# Patient Record
Sex: Female | Born: 1980 | Race: White | Hispanic: No | Marital: Single
Health system: Southern US, Community
[De-identification: ages and names within clinical notes are randomized; demographics above are authoritative.]

## PROBLEM LIST (undated history)

## (undated) DIAGNOSIS — F319 Bipolar disorder, unspecified: Secondary | ICD-10-CM

## (undated) DIAGNOSIS — K5190 Ulcerative colitis, unspecified, without complications: Secondary | ICD-10-CM

## (undated) DIAGNOSIS — A419 Sepsis, unspecified organism: Secondary | ICD-10-CM

## (undated) DIAGNOSIS — G8929 Other chronic pain: Secondary | ICD-10-CM

## (undated) DIAGNOSIS — R011 Cardiac murmur, unspecified: Secondary | ICD-10-CM

## (undated) DIAGNOSIS — G894 Chronic pain syndrome: Secondary | ICD-10-CM

## (undated) DIAGNOSIS — J45909 Unspecified asthma, uncomplicated: Secondary | ICD-10-CM

## (undated) DIAGNOSIS — Z87890 Personal history of sex reassignment: Secondary | ICD-10-CM

## (undated) DIAGNOSIS — G4730 Sleep apnea, unspecified: Secondary | ICD-10-CM

## (undated) DIAGNOSIS — N289 Disorder of kidney and ureter, unspecified: Secondary | ICD-10-CM

## (undated) DIAGNOSIS — N319 Neuromuscular dysfunction of bladder, unspecified: Secondary | ICD-10-CM

## (undated) HISTORY — PX: KNEE ARTHROSCOPY: SUR90

## (undated) HISTORY — PX: INSERTION OF SUPRAPUBIC CATHETER: SHX5870

## (undated) HISTORY — PX: INSERTION CENTRAL VENOUS ACCESS DEVICE W/ SUBCUTANEOUS PORT: SUR725

## (undated) HISTORY — PX: MASTECTOMY: SHX3

## (undated) HISTORY — PX: BLADDER SURGERY: SHX569

---

## 2013-06-30 ENCOUNTER — Encounter (HOSPITAL_BASED_OUTPATIENT_CLINIC_OR_DEPARTMENT_OTHER): Payer: Self-pay | Admitting: Emergency Medicine

## 2013-06-30 ENCOUNTER — Inpatient Hospital Stay (HOSPITAL_BASED_OUTPATIENT_CLINIC_OR_DEPARTMENT_OTHER)
Admission: EM | Admit: 2013-06-30 | Discharge: 2013-07-07 | DRG: 314 | Disposition: A | Discharge status: Home / Self Care | Payer: Medicare (Managed Care) | Attending: Internal Medicine | Admitting: Internal Medicine

## 2013-06-30 ENCOUNTER — Emergency Department (HOSPITAL_BASED_OUTPATIENT_CLINIC_OR_DEPARTMENT_OTHER): Payer: Medicare (Managed Care)

## 2013-06-30 DIAGNOSIS — Z6841 Body Mass Index (BMI) 40.0 and over, adult: Secondary | ICD-10-CM

## 2013-06-30 DIAGNOSIS — Z87898 Personal history of other specified conditions: Secondary | ICD-10-CM

## 2013-06-30 DIAGNOSIS — F640 Transsexualism: Secondary | ICD-10-CM | POA: Diagnosis present

## 2013-06-30 DIAGNOSIS — K5909 Other constipation: Secondary | ICD-10-CM

## 2013-06-30 DIAGNOSIS — R6510 Systemic inflammatory response syndrome (SIRS) of non-infectious origin without acute organ dysfunction: Secondary | ICD-10-CM | POA: Diagnosis present

## 2013-06-30 DIAGNOSIS — N390 Urinary tract infection, site not specified: Secondary | ICD-10-CM | POA: Diagnosis present

## 2013-06-30 DIAGNOSIS — K5900 Constipation, unspecified: Secondary | ICD-10-CM | POA: Diagnosis present

## 2013-06-30 DIAGNOSIS — T80211A Bloodstream infection due to central venous catheter, initial encounter: Principal | ICD-10-CM | POA: Diagnosis present

## 2013-06-30 DIAGNOSIS — F1210 Cannabis abuse, uncomplicated: Secondary | ICD-10-CM | POA: Diagnosis present

## 2013-06-30 DIAGNOSIS — R7881 Bacteremia: Secondary | ICD-10-CM

## 2013-06-30 DIAGNOSIS — G894 Chronic pain syndrome: Secondary | ICD-10-CM

## 2013-06-30 DIAGNOSIS — F319 Bipolar disorder, unspecified: Secondary | ICD-10-CM

## 2013-06-30 DIAGNOSIS — G9050 Complex regional pain syndrome I, unspecified: Secondary | ICD-10-CM | POA: Diagnosis present

## 2013-06-30 DIAGNOSIS — Y92009 Unspecified place in unspecified non-institutional (private) residence as the place of occurrence of the external cause: Secondary | ICD-10-CM

## 2013-06-30 DIAGNOSIS — J45909 Unspecified asthma, uncomplicated: Secondary | ICD-10-CM

## 2013-06-30 DIAGNOSIS — R509 Fever, unspecified: Secondary | ICD-10-CM

## 2013-06-30 DIAGNOSIS — Z9359 Other cystostomy status: Secondary | ICD-10-CM

## 2013-06-30 DIAGNOSIS — Z931 Gastrostomy status: Secondary | ICD-10-CM

## 2013-06-30 DIAGNOSIS — E669 Obesity, unspecified: Secondary | ICD-10-CM | POA: Diagnosis present

## 2013-06-30 DIAGNOSIS — B9689 Other specified bacterial agents as the cause of diseases classified elsewhere: Secondary | ICD-10-CM | POA: Diagnosis present

## 2013-06-30 DIAGNOSIS — Y849 Medical procedure, unspecified as the cause of abnormal reaction of the patient, or of later complication, without mention of misadventure at the time of the procedure: Secondary | ICD-10-CM | POA: Diagnosis present

## 2013-06-30 DIAGNOSIS — N319 Neuromuscular dysfunction of bladder, unspecified: Secondary | ICD-10-CM | POA: Diagnosis present

## 2013-06-30 DIAGNOSIS — A4901 Methicillin susceptible Staphylococcus aureus infection, unspecified site: Secondary | ICD-10-CM | POA: Diagnosis present

## 2013-06-30 DIAGNOSIS — K5190 Ulcerative colitis, unspecified, without complications: Secondary | ICD-10-CM

## 2013-06-30 DIAGNOSIS — A419 Sepsis, unspecified organism: Secondary | ICD-10-CM

## 2013-06-30 HISTORY — DX: Cardiac murmur, unspecified: R01.1

## 2013-06-30 HISTORY — DX: Unspecified asthma, uncomplicated: J45.909

## 2013-06-30 HISTORY — DX: Other chronic pain: G89.29

## 2013-06-30 HISTORY — DX: Chronic pain syndrome: G89.4

## 2013-06-30 HISTORY — DX: Ulcerative colitis, unspecified, without complications: K51.90

## 2013-06-30 HISTORY — DX: Sepsis, unspecified organism: A41.9

## 2013-06-30 HISTORY — DX: Bipolar disorder, unspecified: F31.9

## 2013-06-30 HISTORY — DX: Disorder of kidney and ureter, unspecified: N28.9

## 2013-06-30 HISTORY — DX: Neuromuscular dysfunction of bladder, unspecified: N31.9

## 2013-06-30 HISTORY — DX: Sleep apnea, unspecified: G47.30

## 2013-06-30 HISTORY — DX: Personal history of sex reassignment: Z87.890

## 2013-06-30 LAB — CBC WITH DIFFERENTIAL/PLATELET
Eosinophils Relative: 1 % (ref 0–5)
HCT: 39 % (ref 39.0–52.0)
Lymphocytes Relative: 12 % (ref 12–46)
Lymphs Abs: 0.9 10*3/uL (ref 0.7–4.0)
MCHC: 33.8 g/dL (ref 30.0–36.0)
MCV: 85.5 fL (ref 78.0–100.0)
Monocytes Absolute: 0.5 10*3/uL (ref 0.1–1.0)
RBC: 4.56 MIL/uL (ref 4.22–5.81)
WBC: 7.9 10*3/uL (ref 4.0–10.5)

## 2013-06-30 LAB — URINALYSIS, ROUTINE W REFLEX MICROSCOPIC
Bilirubin Urine: NEGATIVE
Glucose, UA: NEGATIVE mg/dL
Hgb urine dipstick: NEGATIVE
Nitrite: POSITIVE — AB
Protein, ur: NEGATIVE mg/dL
Specific Gravity, Urine: 1.01 (ref 1.005–1.030)
Urobilinogen, UA: 0.2 mg/dL (ref 0.0–1.0)

## 2013-06-30 LAB — CG4 I-STAT (LACTIC ACID): Lactic Acid, Venous: 0.64 mmol/L (ref 0.5–2.2)

## 2013-06-30 LAB — COMPREHENSIVE METABOLIC PANEL
ALT: 12 U/L (ref 0–53)
AST: 14 U/L (ref 0–37)
CO2: 25 mEq/L (ref 19–32)
Calcium: 8.9 mg/dL (ref 8.4–10.5)
Creatinine, Ser: 1 mg/dL (ref 0.50–1.35)
GFR calc Af Amer: >90 mL/min (ref >90)
GFR calc non Af Amer: >90 mL/min (ref >90)
Glucose, Bld: 105 mg/dL — ABNORMAL HIGH (ref 70–99)
Sodium: 136 mEq/L (ref 135–145)
Total Bilirubin: 0.6 mg/dL (ref 0.3–1.2)
Total Protein: 6.5 g/dL (ref 6.0–8.3)

## 2013-06-30 LAB — INFLUENZA PANEL BY PCR (TYPE A & B)
H1N1 flu by pcr: NOT DETECTED
Influenza A By PCR: NEGATIVE
Influenza B By PCR: NEGATIVE

## 2013-06-30 LAB — MRSA PCR SCREENING: MRSA by PCR: NEGATIVE

## 2013-06-30 LAB — URINE MICROSCOPIC-ADD ON

## 2013-06-30 LAB — MAGNESIUM: Magnesium: 1.6 mg/dL (ref 1.5–2.5)

## 2013-06-30 MED ORDER — SORBITOL 70 % SOLN
30.0000 mL | Freq: Every day | Status: DC | PRN
  Filled 2013-06-30: qty 30

## 2013-06-30 MED ORDER — PROMETHAZINE HCL 25 MG/ML IJ SOLN: 25.0000 mg | Freq: Four times a day (QID) | INTRAMUSCULAR | Status: DC | PRN

## 2013-06-30 MED ORDER — POLYETHYLENE GLYCOL 3350 17 G PO PACK
17.0000 g | PACK | Freq: Every day | ORAL | Status: DC
  Administered 2013-06-30 – 2013-07-07 (×5): 17 g via ORAL
  Filled 2013-06-30 (×8): qty 1

## 2013-06-30 MED ORDER — LITHIUM CARBONATE ER 450 MG PO TBCR
900.0000 mg | EXTENDED_RELEASE_TABLET | Freq: Every day | ORAL | Status: DC
  Administered 2013-06-30 – 2013-07-06 (×7): 900 mg via ORAL
  Filled 2013-06-30 (×9): qty 2

## 2013-06-30 MED ORDER — SODIUM CHLORIDE 0.9 % IJ SOLN
3.0000 mL | Freq: Two times a day (BID) | INTRAMUSCULAR | Status: DC
  Administered 2013-06-30 – 2013-07-01 (×2): 3 mL via INTRAVENOUS

## 2013-06-30 MED ORDER — DRONABINOL 5 MG PO CAPS: 10.0000 mg | ORAL_CAPSULE | Freq: Four times a day (QID) | ORAL | Status: DC

## 2013-06-30 MED ORDER — ACETAMINOPHEN 500 MG PO TABS
ORAL_TABLET | ORAL | Status: AC
  Filled 2013-06-30: qty 1

## 2013-06-30 MED ORDER — ONDANSETRON HCL 4 MG/2ML IJ SOLN
4.0000 mg | Freq: Four times a day (QID) | INTRAMUSCULAR | Status: DC | PRN
  Administered 2013-06-30 – 2013-07-06 (×7): 4 mg via INTRAVENOUS
  Filled 2013-06-30 (×8): qty 2

## 2013-06-30 MED ORDER — TESTOSTERONE ENANTHATE 200 MG/ML IM SOLN: 140.0000 mg | INTRAMUSCULAR | Status: DC

## 2013-06-30 MED ORDER — OXYBUTYNIN CHLORIDE 5 MG PO TABS
5.0000 mg | ORAL_TABLET | Freq: Two times a day (BID) | ORAL | Status: DC
  Administered 2013-06-30 – 2013-07-07 (×13): 5 mg via ORAL
  Filled 2013-06-30 (×16): qty 1

## 2013-06-30 MED ORDER — LEVOFLOXACIN IN D5W 750 MG/150ML IV SOLN
750.0000 mg | INTRAVENOUS | Status: DC
  Administered 2013-06-30: 750 mg via INTRAVENOUS
  Filled 2013-06-30: qty 150

## 2013-06-30 MED ORDER — MAGNESIUM CITRATE PO SOLN: 1.0000 | Freq: Once | ORAL | Status: AC | PRN

## 2013-06-30 MED ORDER — SODIUM CHLORIDE 0.9 % IV SOLN
1000.0000 mL | Freq: Once | INTRAVENOUS | Status: AC
  Administered 2013-06-30: 1000 mL via INTRAVENOUS

## 2013-06-30 MED ORDER — FENTANYL 12 MCG/HR TD PT72
12.5000 ug | MEDICATED_PATCH | TRANSDERMAL | Status: DC
  Administered 2013-07-03: 12.5 ug via TRANSDERMAL
  Filled 2013-06-30: qty 1

## 2013-06-30 MED ORDER — DOCUSATE SODIUM 100 MG PO CAPS
100.0000 mg | ORAL_CAPSULE | Freq: Two times a day (BID) | ORAL | Status: DC
  Administered 2013-06-30 – 2013-07-07 (×12): 100 mg via ORAL
  Filled 2013-06-30 (×18): qty 1

## 2013-06-30 MED ORDER — FENTANYL 50 MCG/HR TD PT72
50.0000 ug | MEDICATED_PATCH | TRANSDERMAL | Status: DC
  Administered 2013-07-03: 50 ug via TRANSDERMAL
  Filled 2013-06-30: qty 1

## 2013-06-30 MED ORDER — BUPROPION HCL ER (SR) 150 MG PO TB12
300.0000 mg | ORAL_TABLET | Freq: Every day | ORAL | Status: DC
  Administered 2013-06-30 – 2013-07-07 (×7): 300 mg via ORAL
  Filled 2013-06-30 (×8): qty 2

## 2013-06-30 MED ORDER — VANCOMYCIN HCL IN DEXTROSE 1-5 GM/200ML-% IV SOLN
1000.0000 mg | Freq: Once | INTRAVENOUS | Status: DC
  Filled 2013-06-30: qty 200

## 2013-06-30 MED ORDER — DEXTROSE 5 % IV SOLN
1.0000 g | Freq: Three times a day (TID) | INTRAVENOUS | Status: DC
  Administered 2013-06-30 – 2013-07-02 (×4): 1 g via INTRAVENOUS
  Filled 2013-06-30 (×12): qty 1

## 2013-06-30 MED ORDER — METOCLOPRAMIDE HCL 10 MG PO TABS
10.0000 mg | ORAL_TABLET | Freq: Three times a day (TID) | ORAL | Status: DC | PRN
  Administered 2013-06-30 – 2013-07-07 (×2): 10 mg via ORAL
  Filled 2013-06-30 (×3): qty 1

## 2013-06-30 MED ORDER — DRONABINOL 2.5 MG PO CAPS
10.0000 mg | ORAL_CAPSULE | Freq: Three times a day (TID) | ORAL | Status: DC
  Filled 2013-06-30: qty 4

## 2013-06-30 MED ORDER — POLYETHYLENE GLYCOL 3350 17 G PO PACK
17.0000 g | PACK | Freq: Every day | ORAL | Status: DC | PRN
  Filled 2013-06-30: qty 1

## 2013-06-30 MED ORDER — ENOXAPARIN SODIUM 40 MG/0.4ML ~~LOC~~ SOLN: 40.0000 mg | SUBCUTANEOUS | Status: DC

## 2013-06-30 MED ORDER — ACETAMINOPHEN 500 MG PO TABS
1000.0000 mg | ORAL_TABLET | Freq: Once | ORAL | Status: AC
  Administered 2013-06-30: 1000 mg via ORAL

## 2013-06-30 MED ORDER — OXYCODONE HCL 5 MG PO TABS
15.0000 mg | ORAL_TABLET | Freq: Every day | ORAL | Status: DC
  Administered 2013-06-30 – 2013-07-07 (×32): 15 mg via ORAL
  Filled 2013-06-30 (×32): qty 3

## 2013-06-30 MED ORDER — LEVALBUTEROL HCL 0.63 MG/3ML IN NEBU
0.6300 mg | INHALATION_SOLUTION | RESPIRATORY_TRACT | Status: DC | PRN
  Filled 2013-06-30: qty 3

## 2013-06-30 MED ORDER — SODIUM CHLORIDE 0.9 % IJ SOLN
10.0000 mL | Freq: Two times a day (BID) | INTRAMUSCULAR | Status: DC
  Administered 2013-07-01: 10 mL via INTRAVENOUS

## 2013-06-30 MED ORDER — DEXTROSE 5 % IV SOLN
2.0000 g | Freq: Once | INTRAVENOUS | Status: DC
  Filled 2013-06-30: qty 2

## 2013-06-30 MED ORDER — SODIUM CHLORIDE 0.9 % IV SOLN
1000.0000 mL | INTRAVENOUS | Status: DC
  Administered 2013-06-30 – 2013-07-01 (×3): 1000 mL via INTRAVENOUS

## 2013-06-30 MED ORDER — DRONABINOL 2.5 MG PO CAPS
10.0000 mg | ORAL_CAPSULE | Freq: Three times a day (TID) | ORAL | Status: DC
  Administered 2013-06-30 – 2013-07-07 (×23): 10 mg via ORAL
  Filled 2013-06-30 (×23): qty 4

## 2013-06-30 MED ORDER — VANCOMYCIN HCL IN DEXTROSE 1-5 GM/200ML-% IV SOLN
1000.0000 mg | Freq: Once | INTRAVENOUS | Status: AC
  Administered 2013-06-30: 1000 mg via INTRAVENOUS
  Filled 2013-06-30: qty 200

## 2013-06-30 MED ORDER — TRAZODONE HCL 100 MG PO TABS
100.0000 mg | ORAL_TABLET | Freq: Every day | ORAL | Status: DC
  Administered 2013-06-30 – 2013-07-06 (×7): 100 mg via ORAL
  Filled 2013-06-30 (×9): qty 1

## 2013-06-30 MED ORDER — MORPHINE SULFATE 2 MG/ML IJ SOLN
2.0000 mg | INTRAMUSCULAR | Status: DC | PRN
  Administered 2013-07-04 – 2013-07-05 (×5): 2 mg via INTRAVENOUS
  Filled 2013-06-30 (×5): qty 1

## 2013-06-30 MED ORDER — DRONABINOL 2.5 MG PO CAPS: 10.0000 mg | ORAL_CAPSULE | Freq: Four times a day (QID) | ORAL | Status: DC

## 2013-06-30 MED ORDER — ACETAMINOPHEN 325 MG PO TABS
650.0000 mg | ORAL_TABLET | ORAL | Status: DC | PRN
  Administered 2013-06-30 – 2013-07-07 (×4): 650 mg via ORAL
  Filled 2013-06-30 (×4): qty 2

## 2013-06-30 MED ORDER — VANCOMYCIN HCL IN DEXTROSE 1-5 GM/200ML-% IV SOLN
1000.0000 mg | Freq: Three times a day (TID) | INTRAVENOUS | Status: DC
  Administered 2013-06-30 – 2013-07-01 (×4): 1000 mg via INTRAVENOUS
  Filled 2013-06-30 (×5): qty 200

## 2013-06-30 NOTE — H&P (Signed)
[facility] History and Physical  Carol [name] ZOX:[id number] DOB: 20-Sep-1980 DOA: 06/30/2013  Referring physician: Dr Gwendolyn Grant PCP: Dr Rockney Ghee, [place], [place]   Chief Complaint: Fever  HPI: Carol [name] is a 32 y.o. transgender, with hx of chronic pain, RSD s/p spinal stimulator, chronic constipation, chronic nausea and emesis s/p Hickman catheter placement summer 2014, s/p suprapubic cathether placement  05/2013 who presents to ED with fever up to 104. Patient states since July of 2014 has had 3 episodes of systemic inflammatory response/sepsis with unknown etiology. Patient states had some chills last night however temperature was normal. Patient woke up this morning to a fever of 101 which haven't went up as high as 104. Patient also noted some erythema around his Hickman site which he feels is secondary to irritation after changing the dressing. No purulence is noted around then. Patient does endorse chronic nausea which is unchanged chronic constipation which is unchanged. Patient denies any emesis. Patient does state has some chest discomfort associated with palpitations and whenever he gets a fever. Patient denies any diarrhea, no dysuria, no cough, no weakness, no headaches. Patient denies any shortness of breath. Patient presented to the ED admit her [place] chest x-ray which was done was unremarkable. Urinalysis which was done was nitrite positive small leukocytes 21-50 WBCs otherwise was within normal limits. Comprehensive metabolic profile which was done was unremarkable. Lactic acid level was 0.64. CBC which was done was within normal limits. We were called to admit the patient.    Review of Systems:  Constitutional:  No weight loss, night sweats, fatigue.  HEENT:  No headaches, Difficulty swallowing,Tooth/dental problems,Sore throat,  No sneezing, itching, ear ache, nasal congestion, post nasal drip,  Cardio-vascular:  No chest pain, Orthopnea, PND,  swelling in lower extremities, anasarca, dizziness, palpitations  GI:  No heartburn, indigestion, abdominal pain, nausea, vomiting, diarrhea, change in bowel habits, loss of appetite  Resp:  No shortness of breath with exertion or at rest. No excess mucus, no productive cough, No non-productive cough, No coughing up of blood.No change in color of mucus.No wheezing.No chest wall deformity  Skin:  no rash or lesions.  GU:  no dysuria, change in color of urine, no urgency or frequency. No flank pain.  Musculoskeletal:  No joint pain or swelling. No decreased range of motion. No back pain.  Psych:  No change in mood or affect. No depression or anxiety. No memory loss.   Past Medical History  Diagnosis Date  . Neurogenic bladder   . 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 Chronic pain   . Sepsis   . Asthma   . Colitis, ulcerative chronic     not currently taking any medications, aschol  . Bipolar 1 disorder   . Murmur   . Renal insufficiency   . Chronic pain syndrome 06/30/2013  . Asthma, chronic 06/30/2013  . Bipolar disorder, unspecified 06/30/2013  . Ulcerative colitis, unspecified 06/30/2013   Past Surgical History  Procedure Laterality Date  . Mastectomy Bilateral     transgender surgery  . Insertion of suprapubic catheter    . Bladder surgery    . Knee arthroscopy    . Insertion central venous access device w/ subcutaneous port     Social History:  reports that he has been passively smoking.  He does not have any smokeless tobacco history on file. He reports that he uses illicit drugs (Marijuana). He reports that he does not drink alcohol.  Allergies  Allergen Reactions  . Cephalosporins Anaphylaxis  . Lovenox [Enoxaparin Sodium] Anaphylaxis  . Sulfa Antibiotics Rash    History reviewed. No pertinent family history.   Prior to Admission medications   Medication Sig Start Date End Date Taking? Authorizing Provider  buPROPion (WELLBUTRIN SR) 150  MG 12 hr tablet Take 300 mg by mouth daily.   Yes Historical Provider, MD  docusate sodium (COLACE) 100 MG capsule Take 100 mg by mouth 2 (two) times daily.   Yes Historical Provider, MD  dronabinol (MARINOL) 10 MG capsule Take 10 mg by mouth 4 (four) times daily.    Yes Historical Provider, MD  fentaNYL (DURAGESIC - DOSED MCG/HR) 12 MCG/HR Place 12.5 mcg onto the skin every 3 (three) days.   Yes Historical Provider, MD  fentaNYL (DURAGESIC - DOSED MCG/HR) 25 MCG/HR patch Place 50 mcg onto the skin every 3 (three) days.   Yes Historical Provider, MD  lithium carbonate (ESKALITH) 450 MG CR tablet Take 900 mg by mouth at bedtime.   Yes Historical Provider, MD  metoCLOPramide (REGLAN) 10 MG tablet Take 10 mg by mouth every 8 (eight) hours as needed for nausea.   Yes Historical Provider, MD  ondansetron (ZOFRAN-ODT) 8 MG disintegrating tablet Take 8 mg by mouth 4 (four) times daily as needed for nausea or vomiting.    Yes Historical Provider, MD  oxybutynin (DITROPAN) 5 MG tablet Take 5 mg by mouth 2 (two) times daily.   Yes Historical Provider, MD  oxyCODONE (ROXICODONE) 15 MG immediate release tablet Take 15 mg by mouth 5 (five) times daily.    Yes Historical Provider, MD  polyethylene glycol (MIRALAX / GLYCOLAX) packet Take 17 g by mouth daily.   Yes Historical Provider, MD  promethazine (PHENERGAN) 25 MG/ML injection Inject 25 mg into the vein every 6 (six) hours as needed for nausea or vomiting.   Yes Historical Provider, MD  testosterone enanthate (DELATESTRYL) 200 MG/ML injection Inject 140 mg into the muscle every 14 (fourteen) days. For IM use only   Yes Historical Provider, MD  traZODone (DESYREL) 100 MG tablet Take 100 mg by mouth at bedtime.    Yes Historical Provider, MD  UNABLE TO FIND 1 each at bedtime. Med Name:  2 liters of normal saline with potassium.  Administered during bedtime daily for vomiting syndrome.   Yes Historical Provider, MD   Physical Exam: Filed Vitals:   06/30/13 1236   BP: 112/61  Pulse: 104  Temp: 100.9 F (38.3 C)  Resp: 18    BP 112/61  Pulse 104  Temp(Src) 100.9 F (38.3 C) (Oral)  Resp 18  Ht 5\' 5"  (1.651 m)  Wt 99.791 kg (220 lb)  BMI 36.61 kg/m2  SpO2 98%  General:  Appears calm and comfortable Eyes: PERRL, normal lids, irises & conjunctiva ENT: grossly normal hearing, lips & tongue Neck: no LAD, masses or thyromegaly Cardiovascular: Tachycardia, no m/r/g. No LE edema Respiratory: CTA bilaterally, no w/r/r. Normal respiratory effort. Abdomen: soft, ntnd, positive bowel sounds. Skin: Right upper chest wall around Hickman catheter site with some slight erythema underneath dressing. Musculoskeletal: grossly normal tone BUE/BLE Psychiatric: grossly normal mood and affect, speech fluent and appropriate Neurologic: Alert and oriented x3. On his 2 through 53 are grossly intact. No focal deficits. Sensation is intact. Visual fields are intact. Gait not tested secondary to safety.           Labs on Admission:  Basic Metabolic Panel:  Recent Labs Lab 06/30/13 1020  NA 136  K 4.1  CL 101  CO2 25  GLUCOSE 105*  BUN 7  CREATININE 1.00  CALCIUM 8.9   Liver Function Tests:  Recent Labs Lab 06/30/13 1020  AST 14  ALT 12  ALKPHOS 61  BILITOT 0.6  PROT 6.5  ALBUMIN 3.9   No results found for this basename: LIPASE, AMYLASE,  in the last 168 hours No results found for this basename: AMMONIA,  in the last 168 hours CBC:  Recent Labs Lab 06/30/13 1020  WBC 7.9  NEUTROABS 6.3  HGB 13.2  HCT 39.0  MCV 85.5  PLT 199   Cardiac Enzymes: No results found for this basename: CKTOTAL, CKMB, CKMBINDEX, TROPONINI,  in the last 168 hours  BNP (last 3 results) No results found for this basename: PROBNP,  in the last 8760 hours CBG: No results found for this basename: GLUCAP,  in the last 168 hours  Radiological Exams on Admission: Dg Chest Port 1 View  (if Code Sepsis Called)  06/30/2013   CLINICAL DATA:  Fever, cough  EXAM:  PORTABLE CHEST - 1 VIEW  COMPARISON:  None.  FINDINGS: Low lung volumes. The cardiac silhouette and mediastinal contours unremarkable. No focal regions of consolidation or focal infiltrates. A right-sided central venous catheter via an internal jugular approach is identified tip projecting in the region of the superior vena cava. The osseous structures are unremarkable.  IMPRESSION: No active disease.   Electronically Signed   By: Salome Holmes M.D.   On: 06/30/2013 11:08    EKG: Not done  Assessment/Plan Principal Problem:   Fever Active Problems:   UTI (urinary tract infection)   Chronic pain syndrome   Asthma, chronic   Neurogenic bladder   Bipolar disorder, unspecified   Ulcerative colitis, unspecified   SIRS (systemic inflammatory response syndrome)  #1 systemic inflammatory response/fever Questionable etiology. Urinalysis is nitrite positive. Urine cultures are pending. Patient has had 3 episodes of fevers between July of 2014 now with unknown etiology. Patient states urine cultures and up being negative. Chest x-ray is clear. The cultures are pending. Area around Hickman catheter site with some erythema however no drainage is noted. Will place empirically on IV vancomycin, IV Levaquin, IV history on them. We'll consult with ID for further evaluation and management.  #2 urinary tract infection Urine cultures are pending. Patient empirically on IV Levaquin.  #3 chronic asthma Stable. Nebs as needed.  #4 bipolar disorder Continue home regimen of lithium. Continue Wellbutrin. Check a lithium level.  #5 chronic pain syndrome Continue home pain regimen of fentanyl patches, oxycodone.  #6 chronic nausea Currently stable. Zofran as needed.  #7 history of ulcerative colitis Stable.  #8 prophylaxis PPI for GI prophylaxis. SCDs for DVT prophylaxis.  Code Status: Full Family Communication: updated patient. No family at bedside Disposition Plan: Admit to SDU  Time spent: 70  mins  [facility] MD [facility] Pager [phone]

## 2013-06-30 NOTE — ED Notes (Addendum)
Patient states he has had a fever for the last two days.  States he is visiting from [place] and arrived in [place] on 06/25/13.  States he has chronic vomiting syndrome and has a central line in his right chest for hydration.  States on 06/28/13 when he was changing his central line dressing on 06/28/13 he noticed that there was a large amount of redness around the site.  States he cut the dressing down to fit around the site.  Yesterday, he began to have a fever.  Was seen at a local urgent care yesterday.  Today his fever persist.  Patient also has a neurogenetic bladder and has a chronic suprapubic catheter in place.

## 2013-06-30 NOTE — ED Notes (Signed)
Dr. Loretha Stapler given message to call Twee at main pharmacy at [facility] regarding d/c of second gram of vanc, and cancellation of second antibiotic.

## 2013-06-30 NOTE — Progress Notes (Addendum)
ANTIBIOTIC CONSULT NOTE - INITIAL  Pharmacy Consult:  Vancomycin / Cefepime Indication:  Sepsis  Allergies  Allergen Reactions  . Cephalosporins Anaphylaxis  . Lovenox [Enoxaparin Sodium] Anaphylaxis  . Sulfa Antibiotics Rash    Patient Measurements: Height: 5\' 5"  (165.1 cm) Weight: 220 lb (99.791 kg) IBW/kg (Calculated) : 61.5  Vital Signs: Temp: 103.1 F (39.5 C) (12/26 1002) Temp src: Oral (12/26 1002) BP: 128/80 mmHg (12/26 1002)  Labs: No results found for this basename: WBC, HGB, PLT, LABCREA, CREATININE,  in the last 72 hours CrCl is unknown because no creatinine reading has been taken. No results found for this basename: VANCOTROUGH, VANCOPEAK, VANCORANDOM, GENTTROUGH, GENTPEAK, GENTRANDOM, TOBRATROUGH, TOBRAPEAK, TOBRARND, AMIKACINPEAK, AMIKACINTROU, AMIKACIN,  in the last 72 hours   Microbiology: No results found for this or any previous visit (from the past 720 hour(s)).  Medical History: Past Medical History  Diagnosis Date  . Neurogenic bladder   . 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 Chronic pain   . Sepsis   . Asthma   . Murmur   . Colitis, ulcerative chronic     not currently taking any medications, aschol  . Renal insufficiency   . Bipolar 1 disorder       Assessment: 36 YOM with history of neurogenic bladder with suprapubic catheter and chronic vomiting syndrome with central line for hydration admitted with complaint of fever x 2 days.  Pharmacy consulted to manage vancomycin and cefepime for sepsis.  Noted first doses of antibiotics already ordered.  Baseline labs pending.   Goal of Therapy:  Vancomycin trough level 15-20 mcg/ml   Plan:  - Vanc 1gm IV x 1 per MD - Cefepime 2gm IV x 1 per MD - F/U renal fxn for further dosing    Nawal Burling D. Laney Potash, PharmD, BCPS Pager:  [phone] 06/30/2013, 10:49 AM   ===================================   Addendum: SCr 1, CrCL 113 ml/min Anaphylaxis to cephalosporins, so change  to Azactam and LVQ for now   Plan: - Vanc 1gm IV Q8H - Monitor renal fxn, clinical course, vanc trough at Css - D/C Cefepime as patient has anaphylaxis to - Azactam 1gm IV Q8H - LVQ 750mg  IV Q24H    Amorah Sebring D. Laney Potash, PharmD, BCPS Pager:  [phone] 06/30/2013, 11:33 AM

## 2013-06-30 NOTE — Progress Notes (Signed)
Patient ID: Carol [name], female   DOB: 28-Dec-1980, 32 y.o.   MRN: [id number]         [facility]    Date of Admission:  06/30/2013               Day 1 vancomycin        Day 1 aztreonam        Day 1 levofloxacin       Reason for Consult: Recurrent fevers    Referring Physician: Dr. Ramiro Harvest Primary Care Physician: Dr. Collene Schlichter, [place], [place], [phone]  Principal Problem:   SIRS (systemic inflammatory response syndrome) Active Problems:   Fever   UTI (urinary tract infection)   Chronic pain syndrome   Asthma, chronic   Neurogenic bladder   Bipolar disorder, unspecified   Ulcerative colitis, unspecified   History of nausea and vomiting   Chronic constipation   Reflex sympathetic dystrophy   . acetaminophen      . aztreonam  1 g Intravenous Q8H  . buPROPion  300 mg Oral Daily  . docusate sodium  100 mg Oral BID  . dronabinol  10 mg Oral QID  . fentaNYL  12.5 mcg Transdermal Q72H  . fentaNYL  50 mcg Transdermal Q72H  . levofloxacin (LEVAQUIN) IV  750 mg Intravenous Q24H  . lithium carbonate  900 mg Oral QHS  . oxybutynin  5 mg Oral BID  . oxyCODONE  15 mg Oral 5 X Daily  . polyethylene glycol  17 g Oral Daily  . sodium chloride  3 mL Intravenous Q12H  . testosterone enanthate  140 mg Intramuscular Q14 Days  . traZODone  100 mg Oral QHS  . vancomycin  1,000 mg Intravenous Q8H    Recommendations: 1.  Continue vancomycin and aztreonam 2. Discontinue levofloxacin 3. Discontinue droplet precautions   Assessment: I do not see any obvious source for his current fevers or explanation for his recurring episodes of fever over the last several months. I certainly agree with empiric antibiotic therapy pending results of urine and blood cultures. I placed a call to his primary care physician but his office was already closed for the day. I will followup tomorrow.   HPI: Carol [name] is a 32 y.o. female (transgender female  to female) with a history of neurogenic bladder and lower extremity weakness. He lived with a chronic indwelling Foley catheter for many years but earlier this year underwent a procedure to construct an ileal urostomy. Apparently the procedure did not work and the urostomy had to be reversed. He had a suprapubic catheter placed at that time in July. He had a VAC wound dressing for several weeks. Since that time his chronic nausea and vomiting and constipation worsened. This required placement of a Hickman catheter for IV fluids. Since July he has had 3 episodes of fever that has led to hospitalization in [place] at [facility]. He says that his episodes have been described as SIRS. He states that his cultures have always been negative. He would be placed on antibiotics and improve within 48 hours. Blood cultures returned negative the antibiotics have generally been stopped and he would be discharged home. He has had negative abdominal scans the did not reveal any evidence of intra-abdominal infection. He states that he has lost about 50 pounds unintentionally since his last surgery in July. He is visiting family here for the holidays and developed a fever up to 104 yesterday leading to admission here. He  feels better today.   Review of Systems: Constitutional: positive for anorexia, chills, fevers, malaise and weight loss, negative for sweats Eyes: negative Ears, nose, mouth, throat, and face: negative Respiratory: negative Cardiovascular: negative Gastrointestinal: positive for constipation, nausea and vomiting, negative for change in bowel habits and diarrhea Genitourinary:positive for slight increase in particulate matter in urine recently, negative for hematuria Musculoskeletal:negative  Past Medical History  Diagnosis Date  . Neurogenic bladder   . 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 Chronic pain   . Sepsis   . Asthma   . Colitis, ulcerative chronic      not currently taking any medications, aschol  . Bipolar 1 disorder   . Murmur   . Renal insufficiency   . Chronic pain syndrome 06/30/2013  . Asthma, chronic 06/30/2013  . Bipolar disorder, unspecified 06/30/2013  . Ulcerative colitis, unspecified 06/30/2013    History  Substance Use Topics  . Smoking status: Passive Smoke Exposure - Never Smoker  . Smokeless tobacco: Not on file  . Alcohol Use: No    History reviewed. No pertinent family history. Allergies  Allergen Reactions  . Cephalosporins Anaphylaxis  . Lovenox [Enoxaparin Sodium] Anaphylaxis  . Sulfa Antibiotics Rash    OBJECTIVE: Blood pressure 108/73, pulse 98, temperature 100.4 F (38 C), temperature source Oral, resp. rate 9, height 5\' 5"  (1.651 m), weight 99.791 kg (220 lb), SpO2 99.00%.  General:  He is in no distress visiting with family Skin:  No rash. He has some erythema in the distribution of his previous Tegaderm dressing over his Hickman catheter. No other abnormalities noted on catheter site Oral: No oropharyngeal lesions Lungs:  Clear Cor:  Regular S1 and S2 no murmurs Abdomen:  Multiple healed surgical incisions. Suprapubic catheter site appears normal Neuro: Alert and fully oriented with normal speech and conversation. Chronic, stable extremity weakness Joints and extremities: No acute abnormalities  Lab Results Lab Results  Component Value Date   WBC 7.9 06/30/2013   HGB 13.2 06/30/2013   HCT 39.0 06/30/2013   MCV 85.5 06/30/2013   PLT 199 06/30/2013    Lab Results  Component Value Date   CREATININE 1.00 06/30/2013   BUN 7 06/30/2013   NA 136 06/30/2013   K 4.1 06/30/2013   CL 101 06/30/2013   CO2 25 06/30/2013    Lab Results  Component Value Date   ALT 12 06/30/2013   AST 14 06/30/2013   ALKPHOS 61 06/30/2013   BILITOT 0.6 06/30/2013     Microbiology: No results found for this or any previous visit (from the past 240 hour(s)).  Cliffton Asters, MD [facility] [phone] pager   [phone] cell 06/30/2013, 5:09 PM

## 2013-06-30 NOTE — Plan of Care (Signed)
Problem: Phase I Progression Outcomes Goal: Voiding-avoid urinary catheter unless indicated Outcome: Not Applicable Date Met:  06/30/13 Pt has supra pubic cath

## 2013-06-30 NOTE — ED Provider Notes (Signed)
CSN: [id number]     Arrival date & time 06/30/13  0941 History   First MD Initiated Contact with Patient 06/30/13 [phone]     Chief Complaint  Patient presents with  . Fever   (Consider location/radiation/quality/duration/timing/severity/associated sxs/prior Treatment) Patient is a 32 y.o. female presenting with fever. The history is provided by the patient.  Fever Max temp prior to arrival:  103 Temp source:  Oral Severity:  Severe Onset quality:  Sudden Timing:  Constant Progression:  Worsening Chronicity:  Recurrent Relieved by:  Nothing Worsened by:  Nothing tried Ineffective treatments:  None tried Associated symptoms: no chest pain, no cough, no diarrhea and no vomiting     Past Medical History  Diagnosis Date  . Neurogenic bladder   . 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 Chronic pain   . Sepsis   . Asthma   . Murmur   . Colitis, ulcerative chronic     not currently taking any medications, aschol  . Renal insufficiency   . Bipolar 1 disorder    Past Surgical History  Procedure Laterality Date  . Mastectomy Bilateral     transgender surgery  . Insertion of suprapubic catheter    . Bladder surgery    . Knee arthroscopy    . Insertion central venous access device w/ subcutaneous port     No family history on file. History  Substance Use Topics  . Smoking status: Passive Smoke Exposure - Never Smoker  . Smokeless tobacco: Not on file  . Alcohol Use: No    Review of Systems  Constitutional: Negative for fever.  Respiratory: Negative for cough and shortness of breath.   Cardiovascular: Negative for chest pain and leg swelling.  Gastrointestinal: Negative for vomiting, abdominal pain and diarrhea.  All other systems reviewed and are negative.    Allergies  Cephalosporins; Lovenox; and Sulfa antibiotics  Home Medications   Current Outpatient Rx  Name  Route  Sig  Dispense  Refill  . buPROPion (WELLBUTRIN XL) 300 MG 24 hr tablet  Oral   Take 300 mg by mouth daily.         Marland Kitchen docusate sodium (COLACE) 100 MG capsule   Oral   Take 100 mg by mouth 2 (two) times daily.         Marland Kitchen dronabinol (MARINOL) 10 MG capsule   Oral   Take 10 mg by mouth 2 (two) times daily before a meal. Only uses Marinol in the hospital , vaporizers marijuana at home.         . fentaNYL (DURAGESIC - DOSED MCG/HR) 75 MCG/HR   Transdermal   Place 72 mcg onto the skin every 3 (three) days.         Marland Kitchen lithium 600 MG capsule   Oral   Take 900 mg by mouth at bedtime.         . metoCLOPramide (REGLAN) 10 MG tablet   Oral   Take 10 mg by mouth every 8 (eight) hours as needed for nausea.         . ondansetron (ZOFRAN-ODT) 8 MG disintegrating tablet   Oral   Take 8 mg by mouth every 8 (eight) hours as needed for nausea or vomiting.         Marland Kitchen oxyCODONE (ROXICODONE) 15 MG immediate release tablet   Oral   Take 15 mg by mouth every 4 (four) hours as needed for pain.         Marland Kitchen  polyethylene glycol (MIRALAX / GLYCOLAX) packet   Oral   Take 17 g by mouth daily.         . promethazine (PHENERGAN) 25 MG tablet   Oral   Take 25 mg by mouth every 6 (six) hours as needed for nausea or vomiting.         Marland Kitchen testosterone enanthate (DELATESTRYL) 200 MG/ML injection   Intramuscular   Inject 140 mg into the muscle every 14 (fourteen) days. For IM use only         . traZODone (DESYREL) 100 MG tablet   Oral   Take 100 mg by mouth at bedtime as needed for sleep.         Marland Kitchen UNABLE TO FIND      Med Name:2 liters of normal saline with potassium ? Amount during bedtime daily for vomiting syndrome.        BP 128/80  Temp(Src) 103.1 F (39.5 C) (Oral)  Resp 20  Ht 5\' 5"  (1.651 m)  Wt 220 lb (99.791 kg)  BMI 36.61 kg/m2  SpO2 98% Physical Exam  Nursing note and vitals reviewed. Constitutional: He is oriented to person, place, and time. He appears well-developed and well-nourished. No distress.  HENT:  Head: Normocephalic and  atraumatic.  Mouth/Throat: No oropharyngeal exudate.  Eyes: EOM are normal. Pupils are equal, round, and reactive to light.  Neck: Normal range of motion. Neck supple.  Cardiovascular: Regular rhythm.  Tachycardia present.  Exam reveals no friction rub.   No murmur heard. Pulmonary/Chest: Effort normal and breath sounds normal. No respiratory distress. He has no wheezes. He has no rales.  Abdominal: He exhibits no distension. There is no tenderness. There is no rebound.  Musculoskeletal: Normal range of motion. He exhibits no edema.  Neurological: He is alert and oriented to person, place, and time.  Skin: He is not diaphoretic.    ED Course  Procedures (including critical care time) Labs Review Labs Reviewed  CULTURE, BLOOD (ROUTINE X 2)  CULTURE, BLOOD (ROUTINE X 2)  URINE CULTURE  CULTURE, BLOOD (SINGLE)  LACTIC ACID, PLASMA  CBC WITH DIFFERENTIAL  COMPREHENSIVE METABOLIC PANEL  URINALYSIS, ROUTINE W REFLEX MICROSCOPIC   Imaging Review No results found.  EKG Interpretation   None     CRITICAL CARE Performed by: Dagmar Hait   Total critical care time: 30 minutes  Critical care time was exclusive of separately billable procedures and treating other patients.  Critical care was necessary to treat or prevent imminent or life-threatening deterioration.  Critical care was time spent personally by me on the following activities: development of treatment plan with patient and/or surrogate as well as nursing, discussions with consultants, evaluation of patient's response to treatment, examination of patient, obtaining history from patient or surrogate, ordering and performing treatments and interventions, ordering and review of laboratory studies, ordering and review of radiographic studies, pulse oximetry and re-evaluation of patient's condition.   MDM   1. Fever   2. Sepsis   3. Suprapubic catheter    32 year old female presents with fever. He is is surgically  transgender transsexual. Patient had sudden onset of fever last 0.3. Denies any vomiting, diarrhea, shortness of breath, cough, chest pain, sore throat, ear pain. Patient had no sick contacts. Of note, patient does have a right-sided Hickman catheter in his chest. I was placed for intractable nausea vomiting. Patient also is multiple other medical problems including reflex sympathetic dystrophy, chronic pain requiring a spinal stimulator, suprapubic catheter. He was trying  to receive a Mitrofanoff but it failed and he now suprapubic catheter. The Hickman catheter was used to give him fluids for his intractable nausea and vomiting. He has never had a catheter infection before. He reports multiple episodes of fevers and he is diagnosed with service and was given antibiotics but usually gets better. There have catheter line infections. He said he recently had cultures drawn that showed strep pneumo, but his primary doctor that is secondary to a contaminant repeat culture normal. He is not on any chronic antibiotics. Patient here febrile, tachycardic. Normotensive. Lungs are clear, belly is soft. His right chest Hickman catheter has some redness, however this is in the shape of his adhesive dressing and does not appear cellulitic. He has any drainage from his catheter. He is and is running cellulitis. His belly is benign. His suprapubic catheter site is benign. Concern for possible line infection versus other source of sepsis. Sepsis protocol initiated, cultures drawn, labs drawn, fluids initiated. Patient admitted to [facility] at [facility] for further evaluation and observation.   Dagmar Hait, MD 06/30/13 [phone]

## 2013-07-01 ENCOUNTER — Inpatient Hospital Stay (HOSPITAL_COMMUNITY): Payer: Medicare (Managed Care)

## 2013-07-01 DIAGNOSIS — B9689 Other specified bacterial agents as the cause of diseases classified elsewhere: Secondary | ICD-10-CM

## 2013-07-01 DIAGNOSIS — R7881 Bacteremia: Secondary | ICD-10-CM

## 2013-07-01 DIAGNOSIS — R82998 Other abnormal findings in urine: Secondary | ICD-10-CM

## 2013-07-01 LAB — COMPREHENSIVE METABOLIC PANEL
ALT: 9 U/L (ref 0–53)
AST: 14 U/L (ref 0–37)
Albumin: 2.9 g/dL — ABNORMAL LOW (ref 3.5–5.2)
BUN: 6 mg/dL (ref 6–23)
Calcium: 8 mg/dL — ABNORMAL LOW (ref 8.4–10.5)
Chloride: 98 mEq/L (ref 96–112)
Creatinine, Ser: 0.94 mg/dL (ref 0.50–1.35)
Sodium: 136 mEq/L (ref 135–145)
Total Bilirubin: 0.4 mg/dL (ref 0.3–1.2)
Total Protein: 5.3 g/dL — ABNORMAL LOW (ref 6.0–8.3)

## 2013-07-01 LAB — CBC
HCT: 35.2 % — ABNORMAL LOW (ref 39.0–52.0)
Hemoglobin: 11.7 g/dL — ABNORMAL LOW (ref 13.0–17.0)
MCH: 29 pg (ref 26.0–34.0)
MCV: 87.3 fL (ref 78.0–100.0)
RBC: 4.03 MIL/uL — ABNORMAL LOW (ref 4.22–5.81)
WBC: 5.5 10*3/uL (ref 4.0–10.5)

## 2013-07-01 MED ORDER — BOOST / RESOURCE BREEZE PO LIQD
1.0000 | Freq: Two times a day (BID) | ORAL | Status: DC
  Administered 2013-07-01 – 2013-07-07 (×7): 1 via ORAL

## 2013-07-01 MED ORDER — ADULT MULTIVITAMIN W/MINERALS CH
1.0000 | ORAL_TABLET | Freq: Every day | ORAL | Status: DC
  Administered 2013-07-01 – 2013-07-07 (×5): 1 via ORAL
  Filled 2013-07-01 (×7): qty 1

## 2013-07-01 MED ORDER — SODIUM CHLORIDE 0.9 % IJ SOLN
10.0000 mL | INTRAMUSCULAR | Status: DC | PRN
  Administered 2013-07-01 – 2013-07-03 (×5): 10 mL

## 2013-07-01 MED ORDER — SODIUM CHLORIDE 0.9 % IV SOLN
INTRAVENOUS | Status: DC
  Administered 2013-06-30 – 2013-07-06 (×7): via INTRAVENOUS

## 2013-07-01 MED ORDER — VANCOMYCIN HCL 10 G IV SOLR
1250.0000 mg | Freq: Three times a day (TID) | INTRAVENOUS | Status: DC
  Administered 2013-07-02 – 2013-07-07 (×18): 1250 mg via INTRAVENOUS
  Filled 2013-07-01 (×19): qty 1250

## 2013-07-01 NOTE — Evaluation (Signed)
Physical Therapy Evaluation Patient Details Name: Carol [name] MRN: [id number] DOB: 1980/10/11 Today's Date: 07/01/2013 Time: 4098-1191 PT Time Calculation (min): 30 min  PT Assessment / Plan / Recommendation History of Present Illness  Pt admit for SIRS.  History of RSD.    Clinical Impression  Pt admitted with above. Pt currently with functional limitations due to the deficits listed below (see PT Problem List).  Pt will benefit from skilled PT to increase their independence and safety with mobility to allow discharge to the venue listed below.     PT Assessment  Patient needs continued PT services    Follow Up Recommendations  Home health PT;Supervision/Assistance - 24 hour                Equipment Recommendations  Rolling walker with 5" wheels         Frequency Min 3X/week    Precautions / Restrictions Precautions Precautions: Fall Restrictions Weight Bearing Restrictions: No   Pertinent Vitals/Pain VSS, no pain per pt      Mobility  Bed Mobility Bed Mobility: Rolling Left;Left Sidelying to Sit;Sitting - Scoot to Edge of Bed Rolling Left: 4: Min guard;With rail Left Sidelying to Sit: 4: Min assist;With rails;HOB elevated Sitting - Scoot to Edge of Bed: 4: Min guard Details for Bed Mobility Assistance: Pt needed a little assist for technique. Transfers Transfers: Sit to Stand;Stand to [employer] Transfers Sit to Stand: 4: Min assist;With upper extremity assist;From bed Stand to Sit: 4: Min assist;With upper extremity assist;To chair/3-in-1;With armrests Stand Pivot Transfers: 4: Min assist Details for Transfer Assistance: Pt able to perform stand pivot with steadying assist at bil UES to get from bed to chair.   Ambulation/Gait Ambulation/Gait Assistance: Not tested (comment) Stairs: No Wheelchair Mobility Wheelchair Mobility: No         PT Diagnosis: Generalized weakness  PT Problem List: Decreased activity tolerance;Decreased balance;Decreased  mobility;Decreased knowledge of use of DME;Decreased safety awareness;Decreased knowledge of precautions PT Treatment Interventions: DME instruction;Gait training;Functional mobility training;Therapeutic activities;Therapeutic exercise;Balance training;Patient/family education     PT Goals(Current goals can be found in the care plan section) Acute Rehab PT Goals Patient Stated Goal: to go home PT Goal Formulation: With patient Time For Goal Achievement: 07/08/13 Potential to Achieve Goals: Good  Visit Information  Last PT Received On: 07/01/13 Assistance Needed: +2 (for ambulation) History of Present Illness: Pt admit for SIRS.  History of RSD.         Prior Functioning  Home Living Family/patient expects to be discharged to:: Private residence Living Arrangements: Alone Available Help at Discharge: Available PRN/intermittently (close to 20 1/2 hours of care a day.  ) Type of Home: House Home Access: Level entry Home Layout: One level Home Equipment: Wheelchair - manual (high toilet, loftstrand crutches) Additional Comments: Reports he has used the wheelchair more lately and not the crutches as he has been getting weaker.   Prior Function Level of Independence: Independent with assistive device(s) Comments: States people provided him min assist when needed for all activity.   Communication Communication: No difficulties    Cognition  Cognition Arousal/Alertness: Awake/alert Behavior During Therapy: WFL for tasks assessed/performed Overall Cognitive Status: Within Functional Limits for tasks assessed    Extremity/Trunk Assessment Upper Extremity Assessment Upper Extremity Assessment: Defer to OT evaluation Lower Extremity Assessment Lower Extremity Assessment: Generalized weakness   Balance Balance Balance Assessed: Yes Static Sitting Balance Static Sitting - Balance Support: Feet supported;No upper extremity supported Static Sitting - Level of Assistance: 5:  Stand by  assistance Static Sitting - Comment/# of Minutes: 2  End of Session PT - End of Session Equipment Utilized During Treatment: Gait belt Activity Tolerance: Patient limited by fatigue Patient left: in chair;with call bell/phone within reach Nurse Communication: Mobility status       INGOLD,Endia Moncur 07/01/2013, 3:11 PM [facility] Acute Rehabilitation [phone] (pager)

## 2013-07-01 NOTE — Progress Notes (Signed)
[facility] Progress Note  TEAM 1 - Stepdown/ICU TEAM   Carol [name] WUJ:[id number] DOB: December 15, 1980 DOA: 06/30/2013 PCP: No primary provider on file.  Admit HPI / Brief Narrative: 32 y.o. female (transgender female to female) with a history of neurogenic bladder and lower extremity weakness. He lived with a chronic indwelling Foley catheter for many years but earlier this year underwent a procedure to construct an ileal urostomy. Apparently the procedure did not work and the urostomy had to be reversed. He had a suprapubic catheter placed at that time in July. He had a VAC wound dressing for several weeks. Since that time his chronic nausea and vomiting and constipation worsened. This required placement of a Hickman catheter for IV fluids. Since July he has had 3 episodes of fever that has led to hospitalization in [place] at [facility]. He says that his episodes have been described as SIRS. He states that his cultures have always been negative. He would be placed on antibiotics and improve within 48 hours. Blood cultures returned negative the antibiotics have generally been stopped and he would be discharged home. He has had negative abdominal scans the did not reveal any evidence of intra-abdominal infection. He states that he has lost about 50 pounds unintentionally since his last surgery in July. He is visiting family here for the holidays and developed a fever up to 104 yesterday leading to admission here. (copied from Dr. Cliffton Asters, ID)  HPI/Subjective: The patient states he feels much better today.  He denies chest pain shortness of breath nausea or vomiting.  He does report crampy abdominal pain which is chronic for him and related to constipation.  Assessment/Plan:  Sepsis (HR 104, Temp 100.9 + UTI + positive blood cx) Searching for source - ID following with [place]   Gram negative rod UTI Suspect this may be chronic contamination or colonization - covered  w/ current abx regardlesss  Gram + cocci in blood cx Worrisome for catheter related smoldering infection, or even SBE - ID following - remains on Vanc - await speciation and sensitivities  Relapsing fevers Patient has had 3 episodes of fevers between July 2014 & now with unknown etiology - as per discussion above   Neurogenic bladder Has suprapubic cath s/p failed illeal conduit  Asthma Well compensated  Bipolar   Chronic pain syndrome Pain currently well controlled   Chronic nausea  Ulcerative colitis  Has been quiescent for years per his report   Obesity - Body mass index is 42.85 kg/(m^2).  Code Status: FULL Family Communication: no family present at time of exam Disposition Plan: stable for transfer to medical bed   Consultants: ID  Procedures: none  Antibiotics: Aztreonam 12/26 >> Levofloxacin 12/26 Vancomycin 12/26 >>  DVT prophylaxis: SCDs only - reported "anayphylaxis" to lovenox   Objective: Blood pressure 110/57, pulse 85, temperature 99 F (37.2 C), temperature source Oral, resp. rate 15, height 5\' 5"  (1.651 m), weight 116.8 kg (257 lb 8 oz), SpO2 97.00%.  Intake/Output Summary (Last 24 hours) at 07/01/13 1248 Last data filed at 07/01/13 0800  Gross per 24 hour  Intake 3221.25 ml  Output   3775 ml  Net -553.75 ml   Exam: General: No acute respiratory distress Chest:  R chest cath site w/o signif erythema or d/c  Lungs: Clear to auscultation bilaterally without wheezes or crackles Cardiovascular: Regular rate and rhythm without murmur gallop or rub normal S1 and S2 Abdomen: Nontender, nondistended, soft, bowel sounds positive, no rebound, no  ascites, no appreciable mass Extremities: No significant cyanosis, clubbing, or edema bilateral lower extremities  Data Reviewed: Basic Metabolic Panel:  Recent Labs Lab 06/30/13 1020 06/30/13 1634 07/01/13 0425  NA 136  --  136  K 4.1  --  3.7  CL 101  --  98  CO2 25  --  29  GLUCOSE 105*  --   126*  BUN 7  --  6  CREATININE 1.00  --  0.94  CALCIUM 8.9  --  8.0*  MG  --  1.6  --    Liver Function Tests:  Recent Labs Lab 06/30/13 1020 07/01/13 0425  AST 14 14  ALT 12 9  ALKPHOS 61 49  BILITOT 0.6 0.4  PROT 6.5 5.3*  ALBUMIN 3.9 2.9*   CBC:  Recent Labs Lab 06/30/13 1020 07/01/13 0425  WBC 7.9 5.5  NEUTROABS 6.3  --   HGB 13.2 11.7*  HCT 39.0 35.2*  MCV 85.5 87.3  PLT 199 165    Recent Results (from the past 240 hour(s))  CULTURE, BLOOD (ROUTINE X 2)     Status: None   Collection Time    06/30/13 10:20 AM      Result Value Range Status   Specimen Description BLOOD RT. ARM   Final   Special Requests Normal BOTTLES DRAWN AEROBIC AND ANAEROBIC  10CC   Final   Culture  Setup Time     Final   Value: 06/30/2013 14:26     Performed at [facility]   Culture     Final   Value: GRAM POSITIVE COCCI IN CLUSTERS     Note: Gram Stain Report Called to,Read Back By and Verified With: [facility] STOWE 07/01/13 AT 0515 RIDK     Performed at [facility]   Report Status PENDING   Incomplete  CULTURE, BLOOD (SINGLE)     Status: None   Collection Time    06/30/13 10:40 AM      Result Value Range Status   Specimen Description BLOOD LFT CHEST   Final   Special Requests Normal BOTTLES DRAWN AEROBIC AND ANAEROBIC 5CC   Final   Culture  Setup Time     Final   Value: 06/30/2013 14:26     Performed at [facility]   Culture     Final   Value:        BLOOD CULTURE RECEIVED NO GROWTH TO DATE CULTURE WILL BE HELD FOR 5 DAYS BEFORE ISSUING A FINAL NEGATIVE REPORT     Performed at [facility]   Report Status PENDING   Incomplete  URINE CULTURE     Status: None   Collection Time    06/30/13 11:15 AM      Result Value Range Status   Specimen Description URINE, SUPRAPUBIC   Final   Special Requests Normal   Final   Culture  Setup Time     Final   Value: 06/30/2013 14:27     Performed at [facility] Count     Final   Value:  >=100,000 COLONIES/ML     Performed at [facility]   Culture     Final   Value: GRAM NEGATIVE RODS     Performed at [facility]   Report Status PENDING   Incomplete  MRSA PCR SCREENING     Status: None   Collection Time    06/30/13  3:58 PM      Result  Value Range Status   MRSA by PCR NEGATIVE  NEGATIVE Final   Comment:            The GeneXpert MRSA Assay (FDA     approved for NASAL specimens     only), is one component of a     comprehensive MRSA colonization     surveillance program. It is not     intended to diagnose MRSA     infection nor to guide or     monitor treatment for     MRSA infections.     Studies:  Recent x-ray studies have been reviewed in detail by the Attending Physician  Scheduled Meds:  Scheduled Meds: . aztreonam  1 g Intravenous Q8H  . buPROPion  300 mg Oral Daily  . docusate sodium  100 mg Oral BID  . dronabinol  10 mg Oral TID AC & HS  . feeding supplement (RESOURCE BREEZE)  1 Container Oral BID BM  . fentaNYL  12.5 mcg Transdermal Q72H  . fentaNYL  50 mcg Transdermal Q72H  . lithium carbonate  900 mg Oral QHS  . multivitamin with minerals  1 tablet Oral Daily  . oxybutynin  5 mg Oral BID  . oxyCODONE  15 mg Oral 5 X Daily  . polyethylene glycol  17 g Oral Daily  . sodium chloride  10 mL Intravenous Q12H  . sodium chloride  3 mL Intravenous Q12H  . [START ON 07/08/2013] testosterone enanthate  140 mg Intramuscular Q14 Days  . traZODone  100 mg Oral QHS  . vancomycin  1,000 mg Intravenous Q8H    Time spent on care of this patient: 35 mins   [facility]  [facility] Office  [phone] Pager - Text Page per Loretha Stapler as per below:  On-Call/Text Page:      [email or web address]      password [email or web address]  If 7PM-7AM, please contact night-coverage [email or web address] Password [email or web address] 07/01/2013, 12:48 PM   LOS: 1 day

## 2013-07-01 NOTE — Progress Notes (Signed)
INITIAL NUTRITION ASSESSMENT  DOCUMENTATION CODES Per approved criteria  -Morbid Obesity   INTERVENTION: Provide Resource breeze BID Encourage healthful eating Provide Multivitamin with minerals daily  NUTRITION DIAGNOSIS: Inadequate oral intake related to poor appetite and nausea as evidenced by 18% weight loss in less than 6 months per pt report.   Goal: Pt to meet >/= 90% of their estimated nutrition needs   Monitor:  PO intake Weight Labs  Reason for Assessment: Malnutrition Screening Tool, score of 4  32 y.o. female  Admitting Dx: SIRS (systemic inflammatory response syndrome)  ASSESSMENT: 32 y.o. transgender, with hx of chronic pain, RSD s/p spinal stimulator, chronic constipation, chronic nausea and emesis s/p Hickman catheter placement summer 2014, s/p suprapubic cathether placement 05/2013 who presents to ED with fever up to 104. Patient states since July of 2014 has had 3 episodes of systemic inflammatory response/sepsis with unknown etiology.   Pt reports that at the beginning of the Summer he weighed 270 lbs but, due to frequent illness, nausea, vomiting, and poor appetite he lost down to 220 lbs. Pt reports weighing 220 lbs PTA but, current weight in chart is 257 lbs. Pt states he typically only eats one Lean Cuisine dinner daily. He has been told to drink [employer] daily due to poor po intake, weight loss, and frequent nausea. Encouraged pt to eat more frequently throughout the day; encouraged pt to eat all food groups daily. Encouraged pt to snack if skipping meals. Pt reports poor appetite today; ate a biscuit and a peice of bacon for breakfast.   Height: Ht Readings from Last 1 Encounters:  06/30/13 5\' 5"  (1.651 m)    Weight: Wt Readings from Last 1 Encounters:  07/01/13 257 lb 8 oz (116.8 kg)    Ideal Body Weight: 136 lbs  % Ideal Body Weight: 189%  Wt Readings from Last 10 Encounters:  07/01/13 257 lb 8 oz (116.8 kg)    Usual Body Weight: 270  lbs  % Usual Body Weight: 95%  BMI:  Body mass index is 42.85 kg/(m^2).  Estimated Nutritional Needs: Kcal: 1900-2100 Protein: 100-120 grams Fluid: 2.9 L/day  Skin: intact  Diet Order: General  EDUCATION NEEDS: -No education needs identified at this time   Intake/Output Summary (Last 24 hours) at 07/01/13 1051 Last data filed at 07/01/13 0800  Gross per 24 hour  Intake 3221.25 ml  Output   3775 ml  Net -553.75 ml    Last BM: 12/12  Labs:   Recent Labs Lab 06/30/13 1020 06/30/13 1634 07/01/13 0425  NA 136  --  136  K 4.1  --  3.7  CL 101  --  98  CO2 25  --  29  BUN 7  --  6  CREATININE 1.00  --  0.94  CALCIUM 8.9  --  8.0*  MG  --  1.6  --   GLUCOSE 105*  --  126*    CBG (last 3)  No results found for this basename: GLUCAP,  in the last 72 hours  Scheduled Meds: . aztreonam  1 g Intravenous Q8H  . buPROPion  300 mg Oral Daily  . docusate sodium  100 mg Oral BID  . dronabinol  10 mg Oral TID AC & HS  . fentaNYL  12.5 mcg Transdermal Q72H  . fentaNYL  50 mcg Transdermal Q72H  . lithium carbonate  900 mg Oral QHS  . oxybutynin  5 mg Oral BID  . oxyCODONE  15 mg Oral 5  X Daily  . polyethylene glycol  17 g Oral Daily  . sodium chloride  10 mL Intravenous Q12H  . sodium chloride  3 mL Intravenous Q12H  . [START ON 07/08/2013] testosterone enanthate  140 mg Intramuscular Q14 Days  . traZODone  100 mg Oral QHS  . vancomycin  1,000 mg Intravenous Q8H    Continuous Infusions: . sodium chloride 1,000 mL (07/01/13 0540)    Past Medical History  Diagnosis Date  . Neurogenic bladder   . 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 Chronic pain   . Sepsis   . Asthma   . Colitis, ulcerative chronic     not currently taking any medications, aschol  . Bipolar 1 disorder   . Murmur   . Renal insufficiency   . Chronic pain syndrome 06/30/2013  . Asthma, chronic 06/30/2013  . Bipolar disorder, unspecified 06/30/2013  . Ulcerative colitis,  unspecified 06/30/2013    Past Surgical History  Procedure Laterality Date  . Mastectomy Bilateral     transgender surgery  . Insertion of suprapubic catheter    . Bladder surgery    . Knee arthroscopy    . Insertion central venous access device w/ subcutaneous port      Ian Malkin RD, LDN Inpatient Clinical Dietitian Pager: [phone] After Hours Pager: [phone]

## 2013-07-01 NOTE — Progress Notes (Signed)
CRITICAL VALUE ALERT  Critical value received:  Positive blood culture in anaerobic bottle, gram positive cocci in clusters  Date of notification:  07/01/13  Time of notification:  0526  Critical value read back:yes  Nurse who received alert:  Madalyn Rob   MD notified (1st page):  M. Lynch  Time of first page:  0527  MD notified (2nd page):  Time of second page:  Responding MD:  M. Burnadette Peter  Time MD responded:  [phone]

## 2013-07-01 NOTE — Progress Notes (Signed)
ANTIBIOTIC CONSULT NOTE - Follow up  Pharmacy Consult:  Vancomycin, Azactam  Indication:  Sepsis  Allergies  Allergen Reactions  . Cephalosporins Anaphylaxis  . Lovenox [Enoxaparin Sodium] Anaphylaxis  . Sulfa Antibiotics Rash    Patient Measurements: Height: 5\' 5"  (165.1 cm) Weight: 257 lb 8 oz (116.8 kg) IBW/kg (Calculated) : 61.5  Vital Signs: Temp: 99.4 F (37.4 C) (12/27 2046) Temp src: Oral (12/27 2046) BP: 111/55 mmHg (12/27 2046) Pulse Rate: 86 (12/27 2046)  Labs:  Recent Labs  06/30/13 1020 07/01/13 0425  WBC 7.9 5.5  HGB 13.2 11.7*  PLT 199 165  CREATININE 1.00 0.94   Estimated Creatinine Clearance: 130.9 ml/min (by C-G formula based on Cr of 0.94).  Recent Labs  07/01/13 2018  VANCOTROUGH 12.5      Assessment: 32 YOM with history of neurogenic bladder with suprapubic catheter and chronic vomiting syndrome with central line for hydration admitted with complaint of fever x 2 days. Currently on Vancomycin and Aztreonam. A 9 hour Vanc trough at steady state was subtherapeutic at 12.5. CrCl stable at 130 mL/min. All other labs as above.   Vanc 12/26 >> Azactam 12/26 >> LVQ 12/26 >> 12/26 Cefepime x 1 dose (anaphylaxis)  12/26 blood cx >> 1/3 GPC in clusters 12/26 ucx >> GNR  Goal of Therapy:  Vancomycin trough level 15-20 mcg/ml   Plan:  - Increase Vanc to 1250 mg IV Q 8 hours  - Continue Azactam 1gm IV Q8H - F/U renal fxn for further dosing    Vinnie Level, PharmD.  Clinical Pharmacist Pager [phone]

## 2013-07-01 NOTE — Progress Notes (Signed)
CRITICAL VALUE ALERT  Critical value received:  Aerobic blood culture positive for Gram + cocci and clusters  Date of notification:  07/01/13  Time of notification:  1230  Critical value read back:yes  Nurse who received alert:  Dayna Barker RN  MD notified (1st page):  Dr. Sharon Seller  Time of first page:  1235  MD notified (2nd page):  Time of second page:  Responding MD:  Dr. Sharon Seller  Time MD responded:  [phone]

## 2013-07-01 NOTE — Progress Notes (Signed)
Patient ID: Seriah Brotzman, female   DOB: [birth year], 32 y.o.   MRN: [id number]         [facility]    Date of Admission:  06/30/2013           Day 2 vancomycin        Day 2 aztreonam  Principal Problem:   SIRS (systemic inflammatory response syndrome) Active Problems:   Fever   UTI (urinary tract infection)   Chronic pain syndrome   Asthma, chronic   Neurogenic bladder   Bipolar disorder, unspecified   Ulcerative colitis, unspecified   History of nausea and vomiting   Chronic constipation   Reflex sympathetic dystrophy   . aztreonam  1 g Intravenous Q8H  . buPROPion  300 mg Oral Daily  . docusate sodium  100 mg Oral BID  . dronabinol  10 mg Oral TID AC & HS  . feeding supplement (RESOURCE BREEZE)  1 Container Oral BID BM  . fentaNYL  12.5 mcg Transdermal Q72H  . fentaNYL  50 mcg Transdermal Q72H  . lithium carbonate  900 mg Oral QHS  . multivitamin with minerals  1 tablet Oral Daily  . oxybutynin  5 mg Oral BID  . oxyCODONE  15 mg Oral 5 X Daily  . polyethylene glycol  17 g Oral Daily  . [START ON 07/08/2013] testosterone enanthate  140 mg Intramuscular Q14 Days  . traZODone  100 mg Oral QHS  . vancomycin  1,000 mg Intravenous Q8H    Subjective: He is feeling much better.   Past Medical History  Diagnosis Date  . Neurogenic bladder   . 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 Chronic pain   . Sepsis   . Asthma   . Colitis, ulcerative chronic     not currently taking any medications, aschol  . Bipolar 1 disorder   . Murmur   . Renal insufficiency   . Chronic pain syndrome 06/30/2013  . Asthma, chronic 06/30/2013  . Bipolar disorder, unspecified 06/30/2013  . Ulcerative colitis, unspecified 06/30/2013    History  Substance Use Topics  . Smoking status: Passive Smoke Exposure - Never Smoker  . Smokeless tobacco: Not on file  . Alcohol Use: No    History reviewed. No pertinent family history.  Allergies    Allergen Reactions  . Cephalosporins Anaphylaxis  . Lovenox [Enoxaparin Sodium] Anaphylaxis  . Sulfa Antibiotics Rash    Objective: Temp:  [99 F (37.2 C)-102.1 F (38.9 C)] 99 F (37.2 C) (12/27 1100) Pulse Rate:  [81-101] 85 (12/27 1100) Resp:  [9-18] 15 (12/27 1100) BP: (100-122)/(55-87) 110/57 mmHg (12/27 1100) SpO2:  [94 %-99 %] 97 % (12/27 1100) Weight:  [116.8 kg (257 lb 8 oz)] 116.8 kg (257 lb 8 oz) (12/27 0500)  General: He is alert and appears comfortable Skin: No rash. Fading erythema around the Hickman site Lungs: Clear Cor: Regular S1 and S2 with no murmurs Abdomen: Soft and nontender  Lab Results Lab Results  Component Value Date   WBC 5.5 07/01/2013   HGB 11.7* 07/01/2013   HCT 35.2* 07/01/2013   MCV 87.3 07/01/2013   PLT 165 07/01/2013    Lab Results  Component Value Date   CREATININE 0.94 07/01/2013   BUN 6 07/01/2013   NA 136 07/01/2013   K 3.7 07/01/2013   CL 98 07/01/2013   CO2 29 07/01/2013    Lab Results  Component Value Date   ALT 9  07/01/2013   AST 14 07/01/2013   ALKPHOS 49 07/01/2013   BILITOT 0.4 07/01/2013      Microbiology: Recent Results (from the past 240 hour(s))  CULTURE, BLOOD (ROUTINE X 2)     Status: None   Collection Time    06/30/13 10:20 AM      Result Value Range Status   Specimen Description BLOOD RT. ARM   Final   Special Requests Normal BOTTLES DRAWN AEROBIC AND ANAEROBIC  10CC   Final   Culture  Setup Time     Final   Value: 06/30/2013 14:26     Performed at [facility]   Culture     Final   Value: GRAM POSITIVE COCCI IN CLUSTERS     Note: Gram Stain Report Called to,Read Back By and Verified With: [facility] STOWE 07/01/13 AT 0515 RIDK     Performed at [facility]   Report Status PENDING   Incomplete  CULTURE, BLOOD (SINGLE)     Status: None   Collection Time    06/30/13 10:40 AM      Result Value Range Status   Specimen Description BLOOD LFT CHEST   Final   Special Requests Normal  BOTTLES DRAWN AEROBIC AND ANAEROBIC 5CC   Final   Culture  Setup Time     Final   Value: 06/30/2013 14:26     Performed at [facility]   Culture     Final   Value:        BLOOD CULTURE RECEIVED NO GROWTH TO DATE CULTURE WILL BE HELD FOR 5 DAYS BEFORE ISSUING A FINAL NEGATIVE REPORT     Performed at [facility]   Report Status PENDING   Incomplete  URINE CULTURE     Status: None   Collection Time    06/30/13 11:15 AM      Result Value Range Status   Specimen Description URINE, SUPRAPUBIC   Final   Special Requests Normal   Final   Culture  Setup Time     Final   Value: 06/30/2013 14:27     Performed at [facility] Count     Final   Value: >=100,000 COLONIES/ML     Performed at [facility]   Culture     Final   Value: GRAM NEGATIVE RODS     Performed at [facility]   Report Status PENDING   Incomplete  MRSA PCR SCREENING     Status: None   Collection Time    06/30/13  3:58 PM      Result Value Range Status   MRSA by PCR NEGATIVE  NEGATIVE Final   Comment:            The GeneXpert MRSA Assay (FDA     approved for NASAL specimens     only), is one component of a     comprehensive MRSA colonization     surveillance program. It is not     intended to diagnose MRSA     infection nor to guide or     monitor treatment for     MRSA infections.    Studies/Results: Dg Chest Port 1 View  (if Code Sepsis Called)  06/30/2013   CLINICAL DATA:  Fever, cough  EXAM: PORTABLE CHEST - 1 VIEW  COMPARISON:  None.  FINDINGS: Low lung volumes. The cardiac silhouette and mediastinal contours unremarkable. No focal regions of consolidation or focal infiltrates. A right-sided  central venous catheter via an internal jugular approach is identified tip projecting in the region of the superior vena cava. The osseous structures are unremarkable.  IMPRESSION: No active disease.   Electronically Signed   By: Salome Holmes M.D.   On: 06/30/2013 11:08     Assessment: His fever is down and he is feeling better on broad empiric antibiotic therapy. Urine cultures are growing gram-negative rods in one of 2 sets of blood cultures are growing gram-positive cocci in clusters. It is too early to determine if these represent the cause of his fever or simply colonizers or contaminants.  Plan: 1. Continue current antibiotics pending final culture results  Cliffton Asters, MD [facility] [phone] pager   [phone] cell 07/01/2013, 1:34 PM

## 2013-07-02 DIAGNOSIS — A419 Sepsis, unspecified organism: Secondary | ICD-10-CM

## 2013-07-02 DIAGNOSIS — R799 Abnormal finding of blood chemistry, unspecified: Secondary | ICD-10-CM

## 2013-07-02 DIAGNOSIS — K5900 Constipation, unspecified: Secondary | ICD-10-CM

## 2013-07-02 DIAGNOSIS — N319 Neuromuscular dysfunction of bladder, unspecified: Secondary | ICD-10-CM

## 2013-07-02 LAB — CBC
Hemoglobin: 11.6 g/dL — ABNORMAL LOW (ref 13.0–17.0)
MCH: 28.6 pg (ref 26.0–34.0)
MCV: 87.7 fL (ref 78.0–100.0)
Platelets: 165 10*3/uL (ref 150–400)
RBC: 4.05 MIL/uL — ABNORMAL LOW (ref 4.22–5.81)
WBC: 5.9 10*3/uL (ref 4.0–10.5)

## 2013-07-02 LAB — URINE CULTURE: Colony Count: >=100000

## 2013-07-02 NOTE — Progress Notes (Signed)
[facility] Progress Note    Carol [name] ZOX:[id number] DOB: [birth year] DOA: 06/30/2013 PCP: No primary provider on file.  Admit HPI / Brief Narrative: 32 y.o. female (transgender female to female) with a history of neurogenic bladder and lower extremity weakness. He lived with a chronic indwelling Foley catheter for many years but earlier this year underwent a procedure to construct an ileal urostomy. Apparently the procedure did not work and the urostomy had to be reversed. He had a suprapubic catheter placed at that time in July. He had a VAC wound dressing for several weeks. Since that time his chronic nausea and vomiting and constipation worsened. This required placement of a Hickman catheter for IV fluids. Since July he has had 3 episodes of fever that has led to hospitalization in [place] at [facility]. He says that his episodes have been described as SIRS. He states that his cultures have always been negative. He would be placed on antibiotics and improve within 48 hours. Blood cultures returned negative the antibiotics have generally been stopped and he would be discharged home. He has had negative abdominal scans the did not reveal any evidence of intra-abdominal infection. He states that he has lost about 50 pounds unintentionally since his last surgery in July. He is visiting family here for the holidays and developed a fever up to 104 yesterday leading to admission here.  HPI/Subjective: Doing well today,+ constipation but does not want any enemas at this time  Assessment/Plan:  Sepsis (HR 104, Temp 100.9 + UTI + positive blood cx), urine with Citrobacter and blood culture with staph aureus -Await blood culture sensitivities and staph - ID following, appreciate the assistance follow for further recommendations  Gram negative rod UTI Suspect this may be chronic contamination or colonization - covered w/ current abx regardlesss  Staph aureus in blood  cx Worrisome for catheter related smoldering infection, or even SBE - ID following - remains on Vanc - await sensitivities  Relapsing fevers Patient has had 3 episodes of fevers between July 2014 & now with unknown etiology - as per discussion above   Neurogenic bladder Has suprapubic cath s/p failed illeal conduit  Asthma Well compensated  Bipolar   Chronic pain syndrome Pain currently well controlled   Chronic nausea  Ulcerative colitis  Has been quiescent for years per his report  Constipation -Continue current bowel regimen, as above patient does not want any enema time. Obesity - Body mass index is 38.59 kg/(m^2).  Code Status: FULL Family Communication: no family present at time of exam Disposition Plan: to home when medically stable  Consultants: ID  Procedures: none  Antibiotics: Aztreonam 12/26 >> Levofloxacin 12/26>>12/26 Vancomycin 12/26 >>  DVT prophylaxis: SCDs only - reported "anayphylaxis" to lovenox   Objective: Blood pressure 105/60, pulse 78, temperature 98.2 F (36.8 C), temperature source Oral, resp. rate 16, height 5\' 5"  (1.651 m), weight 105.2 kg (231 lb 14.8 oz), SpO2 96.00%.  Intake/Output Summary (Last 24 hours) at 07/02/13 1313 Last data filed at 07/02/13 1248  Gross per 24 hour  Intake   1500 ml  Output   4975 ml  Net  -3475 ml   Exam: General: No acute respiratory distress Chest:  R chest cath site w/o signif erythema or d/c  Lungs: Clear to auscultation bilaterally without wheezes or crackles Cardiovascular: Regular rate and rhythm without murmur gallop or rub normal S1 and S2 Abdomen: Nontender, nondistended, soft, bowel sounds positive, no rebound, no ascites, no appreciable mass Extremities: No significant  cyanosis, clubbing, or edema bilateral lower extremities  Data Reviewed: Basic Metabolic Panel:  Recent Labs Lab 06/30/13 1020 06/30/13 1634 07/01/13 0425  NA 136  --  136  K 4.1  --  3.7  CL 101  --  98  CO2  25  --  29  GLUCOSE 105*  --  126*  BUN 7  --  6  CREATININE 1.00  --  0.94  CALCIUM 8.9  --  8.0*  MG  --  1.6  --    Liver Function Tests:  Recent Labs Lab 06/30/13 1020 07/01/13 0425  AST 14 14  ALT 12 9  ALKPHOS 61 49  BILITOT 0.6 0.4  PROT 6.5 5.3*  ALBUMIN 3.9 2.9*   CBC:  Recent Labs Lab 06/30/13 1020 07/01/13 0425 07/02/13 0500  WBC 7.9 5.5 5.9  NEUTROABS 6.3  --   --   HGB 13.2 11.7* 11.6*  HCT 39.0 35.2* 35.5*  MCV 85.5 87.3 87.7  PLT 199 165 165    Recent Results (from the past 240 hour(s))  CULTURE, BLOOD (ROUTINE X 2)     Status: None   Collection Time    06/30/13 10:20 AM      Result Value Range Status   Specimen Description BLOOD RT. ARM   Final   Special Requests Normal BOTTLES DRAWN AEROBIC AND ANAEROBIC  10CC   Final   Culture  Setup Time     Final   Value: 06/30/2013 14:26     Performed at [facility]   Culture     Final   Value: STAPHYLOCOCCUS AUREUS     Note: RIFAMPIN AND GENTAMICIN SHOULD NOT BE USED AS SINGLE DRUGS FOR TREATMENT OF STAPH INFECTIONS.     Note: Gram Stain Report Called to,Read Back By and Verified With: [facility] STOWE 07/01/13 AT 0515 RIDK     Performed at [facility]   Report Status PENDING   Incomplete  CULTURE, BLOOD (SINGLE)     Status: None   Collection Time    06/30/13 10:40 AM      Result Value Range Status   Specimen Description BLOOD LFT CHEST   Final   Special Requests Normal BOTTLES DRAWN AEROBIC AND ANAEROBIC 5CC   Final   Culture  Setup Time     Final   Value: 06/30/2013 14:26     Performed at [facility]   Culture     Final   Value:        BLOOD CULTURE RECEIVED NO GROWTH TO DATE CULTURE WILL BE HELD FOR 5 DAYS BEFORE ISSUING A FINAL NEGATIVE REPORT     Performed at [facility]   Report Status PENDING   Incomplete  CULTURE, BLOOD (ROUTINE X 2)     Status: None   Collection Time    06/30/13 10:55 AM      Result Value Range Status   Specimen Description BLOOD LEFT AC    Final   Special Requests Normal BOTTLES DRAWN AEROBIC AND ANAEROBIC 5CC   Final   Culture  Setup Time     Final   Value: 06/30/2013 14:26     Performed at [facility]   Culture     Final   Value: STAPHYLOCOCCUS AUREUS     Note: RIFAMPIN AND GENTAMICIN SHOULD NOT BE USED AS SINGLE DRUGS FOR TREATMENT OF STAPH INFECTIONS.     Note: Gram Stain Report Called to,Read Back By and Verified With: LAUREN HOUT 07/01/13 @  12:18PM BY RUSCOE A.     Performed at [facility]   Report Status PENDING   Incomplete  URINE CULTURE     Status: None   Collection Time    06/30/13 11:15 AM      Result Value Range Status   Specimen Description URINE, SUPRAPUBIC   Final   Special Requests Normal   Final   Culture  Setup Time     Final   Value: 06/30/2013 14:27     Performed at [facility] Count     Final   Value: >=100,000 COLONIES/ML     Performed at [facility]   Culture     Final   Value: CITROBACTER BRAAKII     Performed at [facility]   Report Status 07/02/2013 FINAL   Final   Organism ID, Bacteria CITROBACTER BRAAKII   Final  MRSA PCR SCREENING     Status: None   Collection Time    06/30/13  3:58 PM      Result Value Range Status   MRSA by PCR NEGATIVE  NEGATIVE Final   Comment:            The GeneXpert MRSA Assay (FDA     approved for NASAL specimens     only), is one component of a     comprehensive MRSA colonization     surveillance program. It is not     intended to diagnose MRSA     infection nor to guide or     monitor treatment for     MRSA infections.     Studies:  I have Reviewed all Recent x-ray studies in detail   Scheduled Meds:  Scheduled Meds: . aztreonam  1 g Intravenous Q8H  . buPROPion  300 mg Oral Daily  . docusate sodium  100 mg Oral BID  . dronabinol  10 mg Oral TID AC & HS  . feeding supplement (RESOURCE BREEZE)  1 Container Oral BID BM  . fentaNYL  12.5 mcg Transdermal Q72H  . fentaNYL  50 mcg  Transdermal Q72H  . lithium carbonate  900 mg Oral QHS  . multivitamin with minerals  1 tablet Oral Daily  . oxybutynin  5 mg Oral BID  . oxyCODONE  15 mg Oral 5 X Daily  . polyethylene glycol  17 g Oral Daily  . [START ON 07/08/2013] testosterone enanthate  140 mg Intramuscular Q14 Days  . traZODone  100 mg Oral QHS  . vancomycin  1,250 mg Intravenous Q8H    Time spent on care of this patient: 35 mins   Kela Millin [phone] [facility] Office  [phone] Pager - Text Page per Loretha Stapler as per below:  On-Call/Text Page:      [email or web address]      password [email or web address]  If 7PM-7AM, please contact night-coverage [email or web address] Password [email or web address] 07/02/2013, 1:13 PM   LOS: 2 days

## 2013-07-02 NOTE — Progress Notes (Signed)
Patient would like to speak to someone concerning other alternative to PICC line placement. Patient is concerned about line getting pulled while using crutches to ambulate.

## 2013-07-02 NOTE — Progress Notes (Addendum)
Patient ID: Carol [name], female   DOB: 1980/11/21, 32 y.o.   MRN: [id number]         [facility]    Date of Admission:  06/30/2013           Day 3 vancomycin        Day 3 aztreonam Principal Problem:   SIRS (systemic inflammatory response syndrome) Active Problems:   Fever   UTI (urinary tract infection)   Chronic pain syndrome   Asthma, chronic   Neurogenic bladder   Bipolar disorder, unspecified   Ulcerative colitis, unspecified   History of nausea and vomiting   Chronic constipation   Reflex sympathetic dystrophy   . aztreonam  1 g Intravenous Q8H  . buPROPion  300 mg Oral Daily  . docusate sodium  100 mg Oral BID  . dronabinol  10 mg Oral TID AC & HS  . feeding supplement (RESOURCE BREEZE)  1 Container Oral BID BM  . fentaNYL  12.5 mcg Transdermal Q72H  . fentaNYL  50 mcg Transdermal Q72H  . lithium carbonate  900 mg Oral QHS  . multivitamin with minerals  1 tablet Oral Daily  . oxybutynin  5 mg Oral BID  . oxyCODONE  15 mg Oral 5 X Daily  . polyethylene glycol  17 g Oral Daily  . [START ON 07/08/2013] testosterone enanthate  140 mg Intramuscular Q14 Days  . traZODone  100 mg Oral QHS  . vancomycin  1,250 mg Intravenous Q8H    Subjective: He says that he is feeling much better but not completely back to baseline. He now tells me that several weeks ago he had 2 blood cultures that grew in "strep". He says that his doctor concluded that this was probably a contaminant. He says he had a normal echocardiogram.   Past Medical History  Diagnosis Date  . Neurogenic bladder   . 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 Chronic pain   . Sepsis   . Asthma   . Colitis, ulcerative chronic     not currently taking any medications, aschol  . Bipolar 1 disorder   . Murmur   . Renal insufficiency   . Chronic pain syndrome 06/30/2013  . Asthma, chronic 06/30/2013  . Bipolar disorder, unspecified 06/30/2013  . Ulcerative colitis,  unspecified 06/30/2013    History  Substance Use Topics  . Smoking status: Passive Smoke Exposure - Never Smoker  . Smokeless tobacco: Not on file  . Alcohol Use: No    History reviewed. No pertinent family history.  Allergies  Allergen Reactions  . Cephalosporins Anaphylaxis  . Lovenox [Enoxaparin Sodium] Anaphylaxis  . Sulfa Antibiotics Rash    Objective: Temp:  [97.5 F (36.4 C)-99.9 F (37.7 C)] 97.5 F (36.4 C) (12/28 1339) Pulse Rate:  [78-105] 84 (12/28 1339) Resp:  [16-18] 16 (12/28 1339) BP: (105-111)/(55-67) 109/65 mmHg (12/28 1339) SpO2:  [96 %-99 %] 97 % (12/28 1339) Weight:  [105.2 kg (231 lb 14.8 oz)] 105.2 kg (231 lb 14.8 oz) (12/28 0442)  General: He is in no distress reading in his bed Skin: Appears normal Lungs: Clear Cor: Regular S1 and S2 with no murmurs Abdomen: Healed incisions. Nontender with no palpable masses or fluctuance. Suprapubic catheter site appears normal Joints and extremities: Normal  Lab Results Lab Results  Component Value Date   WBC 5.9 07/02/2013   HGB 11.6* 07/02/2013   HCT 35.5* 07/02/2013   MCV 87.7 07/02/2013  PLT 165 07/02/2013    Lab Results  Component Value Date   CREATININE 0.94 07/01/2013   BUN 6 07/01/2013   NA 136 07/01/2013   K 3.7 07/01/2013   CL 98 07/01/2013   CO2 29 07/01/2013    Lab Results  Component Value Date   ALT 9 07/01/2013   AST 14 07/01/2013   ALKPHOS 49 07/01/2013   BILITOT 0.4 07/01/2013      Microbiology: Recent Results (from the past 240 hour(s))  CULTURE, BLOOD (ROUTINE X 2)     Status: None   Collection Time    06/30/13 10:20 AM      Result Value Range Status   Specimen Description BLOOD RT. ARM   Final   Special Requests Normal BOTTLES DRAWN AEROBIC AND ANAEROBIC  10CC   Final   Culture  Setup Time     Final   Value: 06/30/2013 14:26     Performed at [facility]   Culture     Final   Value: STAPHYLOCOCCUS AUREUS     Note: RIFAMPIN AND GENTAMICIN SHOULD NOT BE  USED AS SINGLE DRUGS FOR TREATMENT OF STAPH INFECTIONS.     Note: Gram Stain Report Called to,Read Back By and Verified With: [facility] STOWE 07/01/13 AT 0515 RIDK     Performed at [facility]   Report Status PENDING   Incomplete  CULTURE, BLOOD (SINGLE)     Status: None   Collection Time    06/30/13 10:40 AM      Result Value Range Status   Specimen Description BLOOD LFT CHEST   Final   Special Requests Normal BOTTLES DRAWN AEROBIC AND ANAEROBIC 5CC   Final   Culture  Setup Time     Final   Value: 06/30/2013 14:26     Performed at [facility]   Culture     Final   Value:        BLOOD CULTURE RECEIVED NO GROWTH TO DATE CULTURE WILL BE HELD FOR 5 DAYS BEFORE ISSUING A FINAL NEGATIVE REPORT     Performed at [facility]   Report Status PENDING   Incomplete  CULTURE, BLOOD (ROUTINE X 2)     Status: None   Collection Time    06/30/13 10:55 AM      Result Value Range Status   Specimen Description BLOOD LEFT AC   Final   Special Requests Normal BOTTLES DRAWN AEROBIC AND ANAEROBIC 5CC   Final   Culture  Setup Time     Final   Value: 06/30/2013 14:26     Performed at [facility]   Culture     Final   Value: STAPHYLOCOCCUS AUREUS     Note: RIFAMPIN AND GENTAMICIN SHOULD NOT BE USED AS SINGLE DRUGS FOR TREATMENT OF STAPH INFECTIONS.     Note: Gram Stain Report Called to,Read Back By and Verified With: LAUREN HOUT 07/01/13 @ 12:18PM BY RUSCOE A.     Performed at [facility]   Report Status PENDING   Incomplete  URINE CULTURE     Status: None   Collection Time    06/30/13 11:15 AM      Result Value Range Status   Specimen Description URINE, SUPRAPUBIC   Final   Special Requests Normal   Final   Culture  Setup Time     Final   Value: 06/30/2013 14:27     Performed at [facility] Count  Final   Value: >=100,000 COLONIES/ML     Performed at [facility]   Culture     Final   Value: CITROBACTER BRAAKII     Performed  at [facility]   Report Status 07/02/2013 FINAL   Final   Organism ID, Bacteria CITROBACTER BRAAKII   Final  MRSA PCR SCREENING     Status: None   Collection Time    06/30/13  3:58 PM      Result Value Range Status   MRSA by PCR NEGATIVE  NEGATIVE Final   Comment:            The GeneXpert MRSA Assay (FDA     approved for NASAL specimens     only), is one component of a     comprehensive MRSA colonization     surveillance program. It is not     intended to diagnose MRSA     infection nor to guide or     monitor treatment for     MRSA infections.    Assessment: Two of 3 admission blood cultures are growing staph aureus. Both positive sets were from peripheral sticks and I suspect that this is true bacteremia and the cause of his most recent febrile illness. The Hickman is the most likely source. I will continue vancomycin alone pending mark susceptibility results and will order repeat blood cultures. I will not add cefazolin for better MSSA coverage since he has a history of cephalosporin allergy. I will order another transthoracic echocardiogram. He will need to . I think it will be helpful to try to contact his primary care physician tomorrow.  He is growing Citrobacter in his urine culture but I doubt that he has an asymptomatic UTI and staph aureus bacteremia. I do not feel that we need to continue to treat for Citrobacter.  Plan: 1. Continue vancomycin 2. Discontinue aztreonam 3. Repeat blood cultures 4. Transthoracic echocardiogram 5. She will need to have his Hickman catheter removed and replaced with new IV access once blood cultures are negative Recommend trying to contact his primary care physician tomorrow (Dr. Collene Schlichter, [place], [place], [phone])   Cliffton Asters, MD [facility] [phone] pager   [phone] cell 07/02/2013, 1:44 PM

## 2013-07-03 DIAGNOSIS — I2129 ST elevation (STEMI) myocardial infarction involving other sites: Secondary | ICD-10-CM

## 2013-07-03 DIAGNOSIS — Z9359 Other cystostomy status: Secondary | ICD-10-CM

## 2013-07-03 DIAGNOSIS — A4901 Methicillin susceptible Staphylococcus aureus infection, unspecified site: Secondary | ICD-10-CM

## 2013-07-03 LAB — CULTURE, BLOOD (ROUTINE X 2): Special Requests: NORMAL

## 2013-07-03 MED ORDER — SODIUM CHLORIDE 0.9 % IV SOLN
500.0000 mg | Freq: Four times a day (QID) | INTRAVENOUS | Status: DC
  Administered 2013-07-03 – 2013-07-07 (×15): 500 mg via INTRAVENOUS
  Filled 2013-07-03 (×21): qty 500

## 2013-07-03 NOTE — Progress Notes (Addendum)
[facility]  Date of Admission:  06/30/2013  Antibiotics: Antibiotics Given (last 72 hours)   Date/Time Action Medication Dose Rate   06/30/13 1935 Given   vancomycin (VANCOCIN) IVPB 1000 mg/200 mL premix 1,000 mg 200 mL/hr   06/30/13 2244 Given   aztreonam (AZACTAM) 1 g in dextrose 5 % 50 mL IVPB 1 g 100 mL/hr   07/01/13 6578 Given   vancomycin (VANCOCIN) IVPB 1000 mg/200 mL premix 1,000 mg 200 mL/hr   07/01/13 0539 Given   aztreonam (AZACTAM) 1 g in dextrose 5 % 50 mL IVPB 1 g 100 mL/hr   07/01/13 1131 Given   vancomycin (VANCOCIN) IVPB 1000 mg/200 mL premix 1,000 mg 200 mL/hr   07/01/13 1347 Given   aztreonam (AZACTAM) 1 g in dextrose 5 % 50 mL IVPB 1 g 100 mL/hr   07/01/13 2103 Given   vancomycin (VANCOCIN) IVPB 1000 mg/200 mL premix 1,000 mg 200 mL/hr   07/02/13 0210 Given  [was awaiting 2 view x-ray results. okay to use PICC.]   aztreonam (AZACTAM) 1 g in dextrose 5 % 50 mL IVPB 1 g 100 mL/hr   07/02/13 0347 Given   vancomycin (VANCOCIN) 1,250 mg in sodium chloride 0.9 % 250 mL IVPB 1,250 mg 166.7 mL/hr   07/02/13 1050 Given   vancomycin (VANCOCIN) 1,250 mg in sodium chloride 0.9 % 250 mL IVPB 1,250 mg 166.7 mL/hr   07/02/13 1816 Given   vancomycin (VANCOCIN) 1,250 mg in sodium chloride 0.9 % 250 mL IVPB 1,250 mg 166.7 mL/hr   07/03/13 0243 Given   vancomycin (VANCOCIN) 1,250 mg in sodium chloride 0.9 % 250 mL IVPB 1,250 mg 166.7 mL/hr   07/03/13 1123 Given   vancomycin (VANCOCIN) 1,250 mg in sodium chloride 0.9 % 250 mL IVPB 1,250 mg 166.7 mL/hr      Subjective: No fever, no chills  Objective: Temp:  [98.4 F (36.9 C)-98.8 F (37.1 C)] 98.5 F (36.9 C) (12/29 1325) Pulse Rate:  [64-85] 85 (12/29 1325) Resp:  [16-20] 20 (12/29 1325) BP: (101-111)/(47-55) 104/47 mmHg (12/29 1325) SpO2:  [98 %] 98 % (12/29 1325)  General: awake, alert, nad Skin: no rashes Lungs: CTA B Cor: RRR Abdomen: soft, nt, nd, +bs Ext: no edema  Lab  Results Lab Results  Component Value Date   WBC 5.9 07/02/2013   HGB 11.6* 07/02/2013   HCT 35.5* 07/02/2013   MCV 87.7 07/02/2013   PLT 165 07/02/2013    Lab Results  Component Value Date   CREATININE 0.94 07/01/2013   BUN 6 07/01/2013   NA 136 07/01/2013   K 3.7 07/01/2013   CL 98 07/01/2013   CO2 29 07/01/2013    Lab Results  Component Value Date   ALT 9 07/01/2013   AST 14 07/01/2013   ALKPHOS 49 07/01/2013   BILITOT 0.4 07/01/2013      Microbiology: Recent Results (from the past 240 hour(s))  CULTURE, BLOOD (ROUTINE X 2)     Status: None   Collection Time    06/30/13 10:20 AM      Result Value Range Status   Specimen Description BLOOD RT. ARM   Final   Special Requests Normal BOTTLES DRAWN AEROBIC AND ANAEROBIC  10CC   Final   Culture  Setup Time     Final   Value: 06/30/2013 14:26     Performed at [facility]   Culture     Final   Value: STAPHYLOCOCCUS AUREUS  Note: Gram Stain Report Called to,Read Back By and Verified With: [facility] STOWE 07/01/13 AT 0515 RIDK RIFAMPIN AND GENTAMICIN SHOULD NOT BE USED AS SINGLE DRUGS FOR TREATMENT OF STAPH INFECTIONS. This organism DOES NOT demonstrate inducible Clindamycin      resistance in vitro.     GRAM NEGATIVE RODS     Note: Gram Stain Report Called to,Read Back By and Verified With: MARGARET THOMPSON 07/03/13 1155 BY SMITHERSJ     Performed at [facility]   Report Status PENDING   Incomplete   Organism ID, Bacteria STAPHYLOCOCCUS AUREUS   Final  CULTURE, BLOOD (SINGLE)     Status: None   Collection Time    06/30/13 10:40 AM      Result Value Range Status   Specimen Description BLOOD LFT CHEST   Final   Special Requests Normal BOTTLES DRAWN AEROBIC AND ANAEROBIC 5CC   Final   Culture  Setup Time     Final   Value: 06/30/2013 14:26     Performed at [facility]   Culture     Final   Value:        BLOOD CULTURE RECEIVED NO GROWTH TO DATE CULTURE WILL BE HELD FOR 5 DAYS BEFORE ISSUING A  FINAL NEGATIVE REPORT     Performed at [facility]   Report Status PENDING   Incomplete  CULTURE, BLOOD (ROUTINE X 2)     Status: None   Collection Time    06/30/13 10:55 AM      Result Value Range Status   Specimen Description BLOOD LEFT AC   Final   Special Requests Normal BOTTLES DRAWN AEROBIC AND ANAEROBIC 5CC   Final   Culture  Setup Time     Final   Value: 06/30/2013 14:26     Performed at [facility]   Culture     Final   Value: STAPHYLOCOCCUS AUREUS     Note: SUSCEPTIBILITIES PERFORMED ON PREVIOUS CULTURE WITHIN THE LAST 5 DAYS.     Note: Gram Stain Report Called to,Read Back By and Verified With: LAUREN HOUT 07/01/13 @ 12:18PM BY RUSCOE A.     Performed at [facility]   Report Status 07/03/2013 FINAL   Final  URINE CULTURE     Status: None   Collection Time    06/30/13 11:15 AM      Result Value Range Status   Specimen Description URINE, SUPRAPUBIC   Final   Special Requests Normal   Final   Culture  Setup Time     Final   Value: 06/30/2013 14:27     Performed at [facility] Count     Final   Value: >=100,000 COLONIES/ML     Performed at [facility]   Culture     Final   Value: [place]     Performed at [facility]   Report Status 07/02/2013 FINAL   Final   Organism ID, Bacteria CITROBACTER BRAAKII   Final  MRSA PCR SCREENING     Status: None   Collection Time    06/30/13  3:58 PM      Result Value Range Status   MRSA by PCR NEGATIVE  NEGATIVE Final   Comment:            The GeneXpert MRSA Assay (FDA     approved for NASAL specimens     only), is one component of a     comprehensive  MRSA colonization     surveillance program. It is not     intended to diagnose MRSA     infection nor to guide or     monitor treatment for     MRSA infections.  CULTURE, BLOOD (ROUTINE X 2)     Status: None   Collection Time    07/02/13  2:35 PM      Result Value Range Status   Specimen Description  BLOOD LEFT ARM   Final   Special Requests BOTTLES DRAWN AEROBIC AND ANAEROBIC 5CC   Final   Culture  Setup Time     Final   Value: 07/02/2013 22:55     Performed at [facility]   Culture     Final   Value:        BLOOD CULTURE RECEIVED NO GROWTH TO DATE CULTURE WILL BE HELD FOR 5 DAYS BEFORE ISSUING A FINAL NEGATIVE REPORT     Performed at [facility]   Report Status PENDING   Incomplete  CULTURE, BLOOD (ROUTINE X 2)     Status: None   Collection Time    07/02/13  2:40 PM      Result Value Range Status   Specimen Description BLOOD RIGHT ARM   Final   Special Requests BOTTLES DRAWN AEROBIC AND ANAEROBIC 5CC   Final   Culture  Setup Time     Final   Value: 07/02/2013 22:55     Performed at [facility]   Culture     Final   Value:        BLOOD CULTURE RECEIVED NO GROWTH TO DATE CULTURE WILL BE HELD FOR 5 DAYS BEFORE ISSUING A FINAL NEGATIVE REPORT     Performed at [facility]   Report Status PENDING   Incomplete    Studies/Results: Dg Chest 2 View  07/02/2013   CLINICAL DATA:  PICC line placement  EXAM: CHEST  2 VIEW  COMPARISON:  06/30/2013  FINDINGS: Right IJ PICC line tip in the upper SVC at the azygos level. Normal heart size. Low lung volumes. No focal pneumonia, collapse or consolidation. No edema, pleural effusion or pneumothorax. Trachea midline. Thoracic stimulator noted.  IMPRESSION: Right PICC line tip upper SVC  No acute chest process   Electronically Signed   By: Ruel Favors M.D.   On: 07/02/2013 01:51    Assessment/Plan: 1)  Staph aureus bacteremia - repeat blood cultures negative.  Likely source Hickman.  Needs removal.  Patient uses Hickman for periodic episodes of dehydration.  Has had recent positive blood cultures, ? Strep.   -he will need Hickman removal. Requests it be replaced (I would wait 48 hours after removal as long as repeat blood cultures remain negative) -TTE pending -Discussed antibiotic allergies with patient.  He  thinks cephalosporins caused hives, though does not remember reaction.  Tolerates Zosyn.  Does not recall any episode of anyphylaxis-like symptoms.   -I have attempted to contact his PCP who is not in the office. Nurse to call back.    Addendum: I discussed care with his PCPs nurse, they are in the process of getting the Hickman catheter removed so we will remove and have a picc line placed for the long term IV antibiotics.  OK to put in picc after Hickman has been removed as long as blood cultures remain negative 48 hours or more.   -antibiotic allergy is hives, no anaphylaxis.   -TTE negative for vegetation.  Consider TEE which  will change duration of antibiotics.     Staci Righter, MD [facility] [email or web address] [phone] pager   [phone] cell 07/03/2013, 2:18 PM

## 2013-07-03 NOTE — Evaluation (Signed)
Occupational Therapy Evaluation Patient Details Name: Carol [name] MRN: [id number] DOB: [birth year] Today's Date: 07/03/2013 Time: 6213-0865 OT Time Calculation (min): 26 min  OT Assessment / Plan / Recommendation History of present illness Pt admit for SIRS.  History of RSD.     Clinical Impression   Pt demos decline in funciton with ADLs and ADL mobility safety and would benefit from acute OT services to address impairments to help restore PLOF to return home safely    OT Assessment  Patient needs continued OT Services    Follow Up Recommendations  No OT follow up;Supervision/Assistance - 24 hour    Barriers to Discharge   none  Equipment Recommendations  None recommended by OT    Recommendations for Other Services    Frequency  Min 2X/week    Precautions / Restrictions Precautions Precautions: Fall Restrictions Weight Bearing Restrictions: No   Pertinent Vitals/Pain 4/10 B LEs    ADL  Grooming: Performed;Wash/dry hands;Wash/dry face;Min guard;Minimal assistance;Supervision/safety;Set up Where Assessed - Grooming: Supported standing;Unsupported sitting Upper Body Bathing: Simulated;Supervision/safety;Set up Where Assessed - Upper Body Bathing: Unsupported sitting Lower Body Bathing: Simulated;Minimal assistance Upper Body Dressing: Performed;Supervision/safety;Set up Where Assessed - Upper Body Dressing: Unsupported sitting Lower Body Dressing: Performed;Minimal assistance Toilet Transfer: Simulated Toilet Transfer Method: Sit to stand Toileting - Clothing Manipulation and Hygiene: Performed;Minimal assistance Where Assessed - Glass blower/designer Manipulation and Hygiene: Standing Tub/Shower Transfer Method: Not assessed Transfers/Ambulation Related to ADLs: VC's for hand placement on seated surface prior to initiating transfers.     OT Diagnosis: Generalized weakness;Acute pain  OT Problem List: Impaired balance (sitting and/or standing);Pain;Decreased  strength OT Treatment Interventions: Self-care/ADL training;Patient/family education;Balance training;Therapeutic activities;DME and/or AE instruction   OT Goals(Current goals can be found in the care plan section) Acute Rehab OT Goals Patient Stated Goal: to go home OT Goal Formulation: With patient Time For Goal Achievement: 07/10/13 Potential to Achieve Goals: Good ADL Goals Pt Will Perform Grooming: with min guard assist;standing Pt Will Perform Lower Body Bathing: with min guard assist;sit to/from stand Pt Will Perform Lower Body Dressing: with min guard assist;sit to/from stand Pt Will Transfer to Toilet: with supervision;bedside commode;ambulating;regular height toilet;grab bars Pt Will Perform Toileting - Clothing Manipulation and hygiene: with min guard assist;with supervision;sit to/from stand;sitting/lateral leans  Visit Information  Last OT Received On: 07/03/13 Assistance Needed: +1 History of Present Illness: Pt admit for SIRS.  History of RSD.         Prior Functioning     Home Living Family/patient expects to be discharged to:: Private residence Living Arrangements: Alone Available Help at Discharge: Available PRN/intermittently Type of Home: House Home Access: Level entry Home Layout: One level Home Equipment: Wheelchair - manual;Crutches;Walker - standard Additional Comments: Has personal care attendant to assist (min) with ADLs 2x/wk on average, otherwise just for home mgt Prior Function Level of Independence: Independent with assistive device(s) Comments: States people provided him min assist when needed for all activity.   Communication Communication: No difficulties Dominant Hand: Right         Vision/Perception Vision - History Baseline Vision: Wears glasses all the time Patient Visual Report: No change from baseline Perception Perception: Within Functional Limits   Cognition  Cognition Arousal/Alertness: Awake/alert Behavior During  Therapy: WFL for tasks assessed/performed Overall Cognitive Status: Within Functional Limits for tasks assessed    Extremity/Trunk Assessment Upper Extremity Assessment Upper Extremity Assessment: Overall WFL for tasks assessed Lower Extremity Assessment Lower Extremity Assessment: Defer to PT evaluation Cervical / Trunk Assessment Cervical /  Trunk Assessment: Normal     Mobility Bed Mobility Bed Mobility: Supine to Sit;Sitting - Scoot to Edge of Bed Supine to Sit: 5: Supervision Sitting - Scoot to Edge of Bed: 5: Supervision Details for Bed Mobility Assistance: Pt sitting on EOB reading the newspaper on his tray when PT entered.  Transfers Sit to Stand: 4: Min guard;From bed;With upper extremity assist;From chair/3-in-1 Stand to Sit: 4: Min guard;To chair/3-in-1;With upper extremity assist;To bed Details for Transfer Assistance: VC's for hand placement on seated surface prior to initiating transfers.      Exercise     Balance Balance Balance Assessed: Yes Static Sitting Balance Static Sitting - Balance Support: Feet supported;No upper extremity supported Static Sitting - Level of Assistance: 6: Modified independent (Device/Increase time) Static Sitting - Comment/# of Minutes: >5 minutes sitting EOB.  Dynamic Sitting Balance Dynamic Sitting - Balance Support: No upper extremity supported;During functional activity;Feet unsupported Dynamic Sitting - Level of Assistance: 6: Modified independent (Device/Increase time) Static Standing Balance Static Standing - Balance Support: Bilateral upper extremity supported Static Standing - Level of Assistance: 5: Stand by assistance Dynamic Standing Balance Dynamic Standing - Balance Support: Left upper extremity supported;Right upper extremity supported;During functional activity Dynamic Standing - Level of Assistance: 4: Min assist   End of Session OT - End of Session Equipment Utilized During Treatment: Gait belt Activity Tolerance:  Patient tolerated treatment well Patient left: in bed;Other (comment) (sitting EOB)  GO     Galen Manila 07/03/2013, 2:53 PM

## 2013-07-03 NOTE — Progress Notes (Signed)
Utilization review completed.  

## 2013-07-03 NOTE — Progress Notes (Addendum)
ANTIBIOTIC CONSULT NOTE - Follow up  Pharmacy Consult:  Vancomycin, Primaxin Indication:  Sepsis  Allergies  Allergen Reactions  . Cephalosporins Anaphylaxis  . Lovenox [Enoxaparin Sodium] Anaphylaxis  . Sulfa Antibiotics Rash    Patient Measurements: Height: 5\' 5"  (165.1 cm) Weight: 231 lb 14.8 oz (105.2 kg) IBW/kg (Calculated) : 61.5  Vital Signs: Temp: 98.4 F (36.9 C) (12/29 0622) BP: 101/54 mmHg (12/29 0622) Pulse Rate: 64 (12/29 0622)  Labs:  Recent Labs  07/01/13 0425 07/02/13 0500  WBC 5.5 5.9  HGB 11.7* 11.6*  PLT 165 165  CREATININE 0.94  --    Estimated Creatinine Clearance: 123.7 ml/min (by C-G formula based on Cr of 0.94).  Recent Labs  07/01/13 2018  VANCOTROUGH 12.5      Assessment: 32 YOM with history of neurogenic bladder with suprapubic catheter and chronic vomiting syndrome with central line for hydration admitted with complaint of fever x 2 days. Has been on vanc/aztreonam. Blood cultures are now growing MSSA and GNR. Could be citrobacter since he did have that in his urine cultures. He does have a history of allergy to cephalosporins. Recent data shows that cross reactivity is lower.   Vanc 12/26 >> Azactam 12/26 >>12/28 LVQ 12/26 >> 12/26 Cefepime x 1 dose (anaphylaxis) Primaxin 12/29>>  12/26 blood cx >> MSSA, GNR 12/26 ucx >> citrobacter  Goal of Therapy:  Vancomycin trough level 15-20 mcg/ml  Plan:   Cont vanc 1.25g IV q8h Start primaxin 500mg  IV q8

## 2013-07-03 NOTE — Progress Notes (Signed)
Alona Bene in lab called ZO:XWRUE culture from 12/26 - already grew Gram pos. Rods , but now is growinf Gram neg. Rods. Dr Suanne Marker notified- tocall to ID. ID notified - to evaluate. No new orders received.

## 2013-07-03 NOTE — Progress Notes (Signed)
Physical Therapy Treatment Patient Details Name: Carol [name] MRN: [id number] DOB: 1981-05-10 Today's Date: 07/03/2013 Time: 4098-1191 PT Time Calculation (min): 23 min  PT Assessment / Plan / Recommendation  History of Present Illness Pt admit for SIRS.  History of RSD.     PT Comments   Pt showing improvement with mobility and tolerance for functional activity. Pt able to ambulate 75 feet with RW, requiring cues for pt to flatten out his feet during gait training, as he was inverting feet and bearing weight over 5th metatarsal consistently.   Follow Up Recommendations  Home health PT;Supervision for mobility/OOB     Does the patient have the potential to tolerate intense rehabilitation     Barriers to Discharge        Equipment Recommendations  Rolling walker with 5" wheels    Recommendations for Other Services    Frequency Min 3X/week   Progress towards PT Goals Progress towards PT goals: Progressing toward goals  Plan Current plan remains appropriate    Precautions / Restrictions Precautions Precautions: Fall Restrictions Weight Bearing Restrictions: No   Pertinent Vitals/Pain Pt reports 4/10 pain at rest in bilateral LE's while sitting EOB.     Mobility  Bed Mobility Bed Mobility: Not assessed Details for Bed Mobility Assistance: Pt sitting on EOB reading the newspaper on his tray when PT entered.  Transfers Transfers: Sit to Stand;Stand to Sit Sit to Stand: 4: Min guard;From bed;With upper extremity assist Stand to Sit: 4: Min guard;To chair/3-in-1;With upper extremity assist Details for Transfer Assistance: VC's for hand placement on seated surface prior to initiating transfers.  Ambulation/Gait Ambulation/Gait Assistance: 4: Min guard Ambulation Distance (Feet): 75 Feet Assistive device: Rolling walker Ambulation/Gait Assistance Details: VC's for safety awareness with the RW and for fluidity of walker movement.  Gait Pattern: Step-through pattern;Decreased  stride length;Narrow base of support Gait velocity: Decreased General Gait Details: Pt walking on outside of feet holding arches and big toes up in the air. Pt able to flatten feet out with cueing.  Stairs: No Wheelchair Mobility Wheelchair Mobility: No    Exercises     PT Diagnosis:    PT Problem List:   PT Treatment Interventions:     PT Goals (current goals can now be found in the care plan section) Acute Rehab PT Goals Patient Stated Goal: to go home PT Goal Formulation: With patient Time For Goal Achievement: 07/08/13 Potential to Achieve Goals: Good  Visit Information  Last PT Received On: 07/03/13 Assistance Needed: +1 History of Present Illness: Pt admit for SIRS.  History of RSD.      Subjective Data  Subjective: "My legs hurt, but otherwise I'm doing okay." Patient Stated Goal: to go home   Cognition  Cognition Arousal/Alertness: Awake/alert Behavior During Therapy: WFL for tasks assessed/performed Overall Cognitive Status: Within Functional Limits for tasks assessed    Balance  Balance Balance Assessed: Yes Static Sitting Balance Static Sitting - Balance Support: Feet supported;No upper extremity supported Static Sitting - Level of Assistance: 6: Modified independent (Device/Increase time) Static Sitting - Comment/# of Minutes: >5 minutes sitting EOB.  Static Standing Balance Static Standing - Balance Support: Bilateral upper extremity supported Static Standing - Level of Assistance: 5: Stand by assistance  End of Session PT - End of Session Equipment Utilized During Treatment: Gait belt Activity Tolerance: Patient limited by fatigue Patient left: in chair;with call bell/phone within reach Nurse Communication: Mobility status   GP     Ruthann Cancer 07/03/2013, 2:45 PM  Ruthann Cancer, PT, DPT [phone]

## 2013-07-03 NOTE — Progress Notes (Signed)
Small amount of tan with blood tinged discharge from suprapubic cath. Area cleaned and new drain sponge applied. Still declining extra med for constipation, LBM 12/12. Carol [name]

## 2013-07-03 NOTE — Progress Notes (Addendum)
[facility] Progress Note    Carol [name] YNW:[id number] DOB: 1981/04/18 DOA: 06/30/2013 PCP: No PCP Per Patient  Admit HPI / Brief Narrative: 32 y.o. female (transgender female to female) with a history of neurogenic bladder and lower extremity weakness. He lived with a chronic indwelling Foley catheter for many years but earlier this year underwent a procedure to construct an ileal urostomy. Apparently the procedure did not work and the urostomy had to be reversed. He had a suprapubic catheter placed at that time in July. He had a VAC wound dressing for several weeks. Since that time his chronic nausea and vomiting and constipation worsened. This required placement of a Hickman catheter for IV fluids. Since July he has had 3 episodes of fever that has led to hospitalization in [place] at [facility]. He says that his episodes have been described as SIRS. He states that his cultures have always been negative. He would be placed on antibiotics and improve within 48 hours. Blood cultures returned negative the antibiotics have generally been stopped and he would be discharged home. He has had negative abdominal scans the did not reveal any evidence of intra-abdominal infection. He states that he has lost about 50 pounds unintentionally since his last surgery in July. He is visiting family here for the holidays and developed a fever up to 104 yesterday leading to admission here.  HPI/Subjective: Doing well today,+ constipation but does not want any enemas at this time  Assessment/Plan:  Sepsis (HR 104, Temp 100.9 + UTI + positive blood cx), urine with Citrobacter and blood culture with staph aureus and GNR -Blood cultures with oxacillin sensitive staph aureus, and also now with GNR -appreciate the assistance follow for further recommendations -started on imipenem per ID, continuing vanc  -will remove Hickman and plan is to place PICC per IR   Gram negative rod UTI Blood  culture now also with GNR, so this is likely not chronic colonization or contamination>> imipemen started per ID as above   Staph aureus in blood cx Worrisome for catheter related smoldering infection, or even SBE - ID following - remains on Vanc - oxacillin sens  Relapsing fevers Patient has had 3 episodes of fevers between July 2014 & now with unknown etiology - as per discussion above   Neurogenic bladder Has suprapubic cath s/p failed illeal conduit  Asthma Well compensated  Bipolar   Chronic pain syndrome Pain currently well controlled   Chronic nausea  Ulcerative colitis  Has been quiescent for years per his report  Constipation -Continue current bowel regimen, as above patient does not want any enema time. Obesity - Body mass index is 38.59 kg/(m^2).  Code Status: FULL Family Communication: no family present at time of exam Disposition Plan: to home when medically stable  Consultants: ID  Procedures: none  Antibiotics: Aztreonam 12/26 >> Levofloxacin 12/26>>12/26 Vancomycin 12/26 >>  DVT prophylaxis: SCDs only - reported "anayphylaxis" to lovenox   Objective: Blood pressure 104/47, pulse 85, temperature 98.5 F (36.9 C), temperature source Oral, resp. rate 20, height 5\' 5"  (1.651 m), weight 105.2 kg (231 lb 14.8 oz), SpO2 98.00%.  Intake/Output Summary (Last 24 hours) at 07/03/13 1506 Last data filed at 07/03/13 1300  Gross per 24 hour  Intake   1680 ml  Output   3600 ml  Net  -1920 ml   Exam: General: No acute respiratory distress Chest:  R chest cath site w/o signif erythema or d/c  Lungs: Clear to auscultation bilaterally without wheezes or  crackles Cardiovascular: Regular rate and rhythm without murmur gallop or rub normal S1 and S2 Abdomen: Nontender, nondistended, soft, bowel sounds positive, no rebound, no ascites, no appreciable mass Extremities: No significant cyanosis, clubbing, or edema bilateral lower extremities  Data  Reviewed: Basic Metabolic Panel:  Recent Labs Lab 06/30/13 1020 06/30/13 1634 07/01/13 0425  NA 136  --  136  K 4.1  --  3.7  CL 101  --  98  CO2 25  --  29  GLUCOSE 105*  --  126*  BUN 7  --  6  CREATININE 1.00  --  0.94  CALCIUM 8.9  --  8.0*  MG  --  1.6  --    Liver Function Tests:  Recent Labs Lab 06/30/13 1020 07/01/13 0425  AST 14 14  ALT 12 9  ALKPHOS 61 49  BILITOT 0.6 0.4  PROT 6.5 5.3*  ALBUMIN 3.9 2.9*   CBC:  Recent Labs Lab 06/30/13 1020 07/01/13 0425 07/02/13 0500  WBC 7.9 5.5 5.9  NEUTROABS 6.3  --   --   HGB 13.2 11.7* 11.6*  HCT 39.0 35.2* 35.5*  MCV 85.5 87.3 87.7  PLT 199 165 165    Recent Results (from the past 240 hour(s))  CULTURE, BLOOD (ROUTINE X 2)     Status: None   Collection Time    06/30/13 10:20 AM      Result Value Range Status   Specimen Description BLOOD RT. ARM   Final   Special Requests Normal BOTTLES DRAWN AEROBIC AND ANAEROBIC  10CC   Final   Culture  Setup Time     Final   Value: 06/30/2013 14:26     Performed at [facility]   Culture     Final   Value: STAPHYLOCOCCUS AUREUS     Note: Gram Stain Report Called to,Read Back By and Verified With: [facility] STOWE 07/01/13 AT 0515 RIDK RIFAMPIN AND GENTAMICIN SHOULD NOT BE USED AS SINGLE DRUGS FOR TREATMENT OF STAPH INFECTIONS. This organism DOES NOT demonstrate inducible Clindamycin      resistance in vitro.     GRAM NEGATIVE RODS     Note: Gram Stain Report Called to,Read Back By and Verified With: MARGARET THOMPSON 07/03/13 1155 BY SMITHERSJ     Performed at [facility]   Report Status PENDING   Incomplete   Organism ID, Bacteria STAPHYLOCOCCUS AUREUS   Final  CULTURE, BLOOD (SINGLE)     Status: None   Collection Time    06/30/13 10:40 AM      Result Value Range Status   Specimen Description BLOOD LFT CHEST   Final   Special Requests Normal BOTTLES DRAWN AEROBIC AND ANAEROBIC 5CC   Final   Culture  Setup Time     Final   Value: 06/30/2013 14:26      Performed at [facility]   Culture     Final   Value:        BLOOD CULTURE RECEIVED NO GROWTH TO DATE CULTURE WILL BE HELD FOR 5 DAYS BEFORE ISSUING A FINAL NEGATIVE REPORT     Performed at [facility]   Report Status PENDING   Incomplete  CULTURE, BLOOD (ROUTINE X 2)     Status: None   Collection Time    06/30/13 10:55 AM      Result Value Range Status   Specimen Description BLOOD LEFT AC   Final   Special Requests Normal BOTTLES DRAWN AEROBIC AND ANAEROBIC  [facility]   Final   Culture  Setup Time     Final   Value: 06/30/2013 14:26     Performed at [facility]   Culture     Final   Value: STAPHYLOCOCCUS AUREUS     Note: SUSCEPTIBILITIES PERFORMED ON PREVIOUS CULTURE WITHIN THE LAST 5 DAYS.     Note: Gram Stain Report Called to,Read Back By and Verified With: LAUREN HOUT 07/01/13 @ 12:18PM BY RUSCOE A.     Performed at [facility]   Report Status 07/03/2013 FINAL   Final  URINE CULTURE     Status: None   Collection Time    06/30/13 11:15 AM      Result Value Range Status   Specimen Description URINE, SUPRAPUBIC   Final   Special Requests Normal   Final   Culture  Setup Time     Final   Value: 06/30/2013 14:27     Performed at [facility] Count     Final   Value: >=100,000 COLONIES/ML     Performed at [facility]   Culture     Final   Value: [place]     Performed at [facility]   Report Status 07/02/2013 FINAL   Final   Organism ID, Bacteria CITROBACTER BRAAKII   Final  MRSA PCR SCREENING     Status: None   Collection Time    06/30/13  3:58 PM      Result Value Range Status   MRSA by PCR NEGATIVE  NEGATIVE Final   Comment:            The GeneXpert MRSA Assay (FDA     approved for NASAL specimens     only), is one component of a     comprehensive MRSA colonization     surveillance program. It is not     intended to diagnose MRSA     infection nor to guide or     monitor treatment for      MRSA infections.  CULTURE, BLOOD (ROUTINE X 2)     Status: None   Collection Time    07/02/13  2:35 PM      Result Value Range Status   Specimen Description BLOOD LEFT ARM   Final   Special Requests BOTTLES DRAWN AEROBIC AND ANAEROBIC 5CC   Final   Culture  Setup Time     Final   Value: 07/02/2013 22:55     Performed at [facility]   Culture     Final   Value:        BLOOD CULTURE RECEIVED NO GROWTH TO DATE CULTURE WILL BE HELD FOR 5 DAYS BEFORE ISSUING A FINAL NEGATIVE REPORT     Performed at [facility]   Report Status PENDING   Incomplete  CULTURE, BLOOD (ROUTINE X 2)     Status: None   Collection Time    07/02/13  2:40 PM      Result Value Range Status   Specimen Description BLOOD RIGHT ARM   Final   Special Requests BOTTLES DRAWN AEROBIC AND ANAEROBIC 5CC   Final   Culture  Setup Time     Final   Value: 07/02/2013 22:55     Performed at [facility]   Culture     Final   Value:        BLOOD CULTURE RECEIVED NO GROWTH TO DATE CULTURE WILL BE HELD FOR  5 DAYS BEFORE ISSUING A FINAL NEGATIVE REPORT     Performed at [facility]   Report Status PENDING   Incomplete     Studies:  I have Reviewed all Recent x-ray studies in detail   Scheduled Meds:  Scheduled Meds: . buPROPion  300 mg Oral Daily  . docusate sodium  100 mg Oral BID  . dronabinol  10 mg Oral TID AC & HS  . feeding supplement (RESOURCE BREEZE)  1 Container Oral BID BM  . fentaNYL  12.5 mcg Transdermal Q72H  . fentaNYL  50 mcg Transdermal Q72H  . imipenem-cilastatin  500 mg Intravenous Q6H  . lithium carbonate  900 mg Oral QHS  . multivitamin with minerals  1 tablet Oral Daily  . oxybutynin  5 mg Oral BID  . oxyCODONE  15 mg Oral 5 X Daily  . polyethylene glycol  17 g Oral Daily  . [START ON 07/08/2013] testosterone enanthate  140 mg Intramuscular Q14 Days  . traZODone  100 mg Oral QHS  . vancomycin  1,250 mg Intravenous Q8H    Time spent on care of this patient: 35  mins   Kela Millin [phone] [facility] Office  [phone] Pager - Text Page per Loretha Stapler as per below:  On-Call/Text Page:      [email or web address]      password [email or web address]  If 7PM-7AM, please contact night-coverage [email or web address] Password [email or web address] 07/03/2013, 3:06 PM   LOS: 3 days

## 2013-07-03 NOTE — Progress Notes (Signed)
  Echocardiogram 2D Echocardiogram has been performed.  Cathie Beams 07/03/2013, 10:36 AM

## 2013-07-04 ENCOUNTER — Inpatient Hospital Stay (HOSPITAL_COMMUNITY): Payer: Medicare (Managed Care)

## 2013-07-04 LAB — BASIC METABOLIC PANEL
BUN: 5 mg/dL — ABNORMAL LOW (ref 6–23)
Calcium: 8.5 mg/dL (ref 8.4–10.5)
Creatinine, Ser: 0.78 mg/dL (ref 0.50–1.35)
GFR calc Af Amer: >90 mL/min (ref >90)
GFR calc non Af Amer: >90 mL/min (ref >90)
Potassium: 3.8 mEq/L (ref 3.7–5.3)

## 2013-07-04 MED ORDER — SODIUM CHLORIDE 0.9 % IV SOLN: INTRAVENOUS | Status: DC

## 2013-07-04 MED ORDER — MIDAZOLAM HCL 2 MG/2ML IJ SOLN
INTRAMUSCULAR | Status: AC
  Filled 2013-07-04: qty 4

## 2013-07-04 MED ORDER — SODIUM CHLORIDE 0.9 % IJ SOLN
10.0000 mL | INTRAMUSCULAR | Status: DC | PRN
  Administered 2013-07-05 – 2013-07-07 (×5): 10 mL
  Administered 2013-07-07: 20 mL

## 2013-07-04 MED ORDER — FENTANYL CITRATE 0.05 MG/ML IJ SOLN
INTRAMUSCULAR | Status: AC
  Filled 2013-07-04: qty 4

## 2013-07-04 MED ORDER — CHLORHEXIDINE GLUCONATE 4 % EX LIQD
CUTANEOUS | Status: AC
  Filled 2013-07-04: qty 30

## 2013-07-04 NOTE — H&P (Signed)
Carol [name] is an 32 y.o. female.   Chief Complaint: [name] pt from female to female Has had many surgeries for bladder - neurogenic bladder Most recent surgery this summer x 2 Uses R Hickman (placed 02/2013 in [place]- where he lives) for IV fluids and potassium repletion Pt Bipolar and on Lithium--causes diuresis Visiting in [place] and developed fever 104 Now with +Urine Cx and + Blood Cx Request to remove existing Hickman cath and place Left Tunneled peripherally inserted central catheter (MD does not want another Hickman cath)  HPI: Ulc colitis; Bipolar; transgender; chronic pain- reflex sympathetic dystrophy  Past Medical History  Diagnosis Date  . Neurogenic bladder   . 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 Chronic pain   . Sepsis   . Asthma   . Colitis, ulcerative chronic     not currently taking any medications, aschol  . Bipolar 1 disorder   . Murmur   . Renal insufficiency   . Chronic pain syndrome 06/30/2013  . Asthma, chronic 06/30/2013  . Bipolar disorder, unspecified 06/30/2013  . Ulcerative colitis, unspecified 06/30/2013    Past Surgical History  Procedure Laterality Date  . Mastectomy Bilateral     transgender surgery  . Insertion of suprapubic catheter    . Bladder surgery    . Knee arthroscopy    . Insertion central venous access device w/ subcutaneous port      History reviewed. No pertinent family history. Social History:  reports that he has been passively smoking.  He does not have any smokeless tobacco history on file. He reports that he uses illicit drugs (Marijuana). He reports that he does not drink alcohol.  Allergies:  Allergies  Allergen Reactions  . Cephalosporins Anaphylaxis  . Lovenox [Enoxaparin Sodium] Anaphylaxis  . Sulfa Antibiotics Rash      Results for orders placed during the hospital encounter of 06/30/13 (from the past 48 hour(s))  CULTURE, BLOOD (ROUTINE X 2)     Status: None   Collection Time   07/02/13  2:35 PM      Result Value Range   Specimen Description BLOOD LEFT ARM     Special Requests BOTTLES DRAWN AEROBIC AND ANAEROBIC 5CC     Culture  Setup Time       Value: 07/02/2013 22:55     Performed at [facility]   Culture       Value:        BLOOD CULTURE RECEIVED NO GROWTH TO DATE CULTURE WILL BE HELD FOR 5 DAYS BEFORE ISSUING [name] FINAL NEGATIVE REPORT     Performed at [facility]   Report Status PENDING    CULTURE, BLOOD (ROUTINE X 2)     Status: None   Collection Time    07/02/13  2:40 PM      Result Value Range   Specimen Description BLOOD RIGHT ARM     Special Requests BOTTLES DRAWN AEROBIC AND ANAEROBIC 5CC     Culture  Setup Time       Value: 07/02/2013 22:55     Performed at [facility]   Culture       Value:        BLOOD CULTURE RECEIVED NO GROWTH TO DATE CULTURE WILL BE HELD FOR 5 DAYS BEFORE ISSUING [name] FINAL NEGATIVE REPORT     Performed at [facility]   Report Status PENDING     No results found.  Review of Systems  Constitutional:  Positive for weight loss. Negative for fever.  Respiratory: Negative for shortness of breath.   Cardiovascular: Negative for chest pain.  Gastrointestinal: Positive for nausea, vomiting and abdominal pain.  Neurological: Positive for weakness.    Blood pressure 112/56, pulse 85, temperature 98.6 F (37 C), temperature source Oral, resp. rate 18, height 5\' 5"  (1.651 m), weight 231 lb 1.6 oz (104.826 kg), SpO2 99.00%. Physical Exam  Constitutional: He is oriented to person, place, and time. He appears well-developed and well-nourished.  Cardiovascular: Normal rate and regular rhythm.   No murmur heard. Respiratory: Effort normal and breath sounds normal.  GI: Soft. Bowel sounds are normal.  Musculoskeletal: Normal range of motion.  Neurological: He is alert and oriented to person, place, and time.  Skin: Skin is warm and dry.  Psychiatric: He has [name] normal mood and affect. His behavior is  normal. Judgment and thought content normal.     Assessment/Plan Urine and blood cxs + Relapsing fevers Now on antibx Need existing Hickman cath (placed 02/2013) removed and place new L tunneled PICC Pt aware of procedure benefits and risks and agreeable to proceed Consent signed and in chart   CarolPAMELA [name] 07/04/2013, 7:30 AM   Addendum:  Spoke with Dr. Luciana Axe and will place PICC rather than tunneled catheter.   Carol Overlie, MD

## 2013-07-04 NOTE — Progress Notes (Signed)
NUTRITION FOLLOW UP  Intervention:   - Continue Resource Breeze BID - Continue MVI - Continue to encourage adequate PO intake  Nutrition Dx:   Inadequate oral intake related to poor appetite and nausea as evidenced by 18% weight loss in less than 6 months per pt report, ongoing  Goal:   Patient will meet >/=90% of estimated nutrition needs, not meeting  Monitor:   PO intake, weight, labs  Assessment:   32 y.o. transgender, with hx of chronic pain, RSD s/p spinal stimulator, chronic constipation, chronic nausea and emesis s/p Hickman catheter placement summer 2014, s/p suprapubic cathether placement 05/2013 who presents to ED with fever up to 104. Patient states since July of 2014 has had 3 episodes of systemic inflammatory response/sepsis with unknown etiology.   Patient was eating better until yesterday. Current intake poor with ~25% intake of meals. He is receiving Resource Breeze BID. Weight is down to 231 pounds from 257 pounds on 12/27, but suspect this is due to fluid.   Hickman catheter to be removed and PICC placed.   Height: Ht Readings from Last 1 Encounters:  07/02/13 5\' 5"  (1.651 m)    Weight Status:   Wt Readings from Last 1 Encounters:  07/04/13 231 lb 1.6 oz (104.826 kg)    Re-estimated needs:  Kcal: 1900-2100  Protein: 100-120 grams  Fluid: 2.9 L/day  Skin: Intact  Diet Order: NPO   Intake/Output Summary (Last 24 hours) at 07/04/13 1405 Last data filed at 07/04/13 1301  Gross per 24 hour  Intake      0 ml  Output   3900 ml  Net  -3900 ml    Last BM: 12/29   Labs:   Recent Labs Lab 06/30/13 1020 06/30/13 1634 07/01/13 0425 07/04/13 0500  NA 136  --  136 141  K 4.1  --  3.7 3.8  CL 101  --  98 104  CO2 25  --  29 28  BUN 7  --  6 5*  CREATININE 1.00  --  0.94 0.78  CALCIUM 8.9  --  8.0* 8.5  MG  --  1.6  --   --   GLUCOSE 105*  --  126* 77    CBG (last 3)  No results found for this basename: GLUCAP,  in the last 72  hours  Scheduled Meds: . buPROPion  300 mg Oral Daily  . docusate sodium  100 mg Oral BID  . dronabinol  10 mg Oral TID AC & HS  . feeding supplement (RESOURCE BREEZE)  1 Container Oral BID BM  . fentaNYL  12.5 mcg Transdermal Q72H  . fentaNYL  50 mcg Transdermal Q72H  . imipenem-cilastatin  500 mg Intravenous Q6H  . lithium carbonate  900 mg Oral QHS  . multivitamin with minerals  1 tablet Oral Daily  . oxybutynin  5 mg Oral BID  . oxyCODONE  15 mg Oral 5 X Daily  . polyethylene glycol  17 g Oral Daily  . [START ON 07/08/2013] testosterone enanthate  140 mg Intramuscular Q14 Days  . traZODone  100 mg Oral QHS  . vancomycin  1,250 mg Intravenous Q8H    Continuous Infusions: . sodium chloride 75 mL/hr at 07/04/13 0324    Linnell Fulling, RD, LDN Pager #: [phone] After-Hours Pager #: [phone]

## 2013-07-04 NOTE — Progress Notes (Signed)
[facility]  Date of Admission:  06/30/2013  Antibiotics: Antibiotics Given (last 72 hours)   Date/Time Action Medication Dose Rate   07/01/13 1347 Given   aztreonam (AZACTAM) 1 g in dextrose 5 % 50 mL IVPB 1 g 100 mL/hr   07/01/13 2103 Given   vancomycin (VANCOCIN) IVPB 1000 mg/200 mL premix 1,000 mg 200 mL/hr   07/02/13 0210 Given  [was awaiting 2 view x-ray results. okay to use PICC.]   aztreonam (AZACTAM) 1 g in dextrose 5 % 50 mL IVPB 1 g 100 mL/hr   07/02/13 0347 Given   vancomycin (VANCOCIN) 1,250 mg in sodium chloride 0.9 % 250 mL IVPB 1,250 mg 166.7 mL/hr   07/02/13 1050 Given   vancomycin (VANCOCIN) 1,250 mg in sodium chloride 0.9 % 250 mL IVPB 1,250 mg 166.7 mL/hr   07/02/13 1816 Given   vancomycin (VANCOCIN) 1,250 mg in sodium chloride 0.9 % 250 mL IVPB 1,250 mg 166.7 mL/hr   07/03/13 0243 Given   vancomycin (VANCOCIN) 1,250 mg in sodium chloride 0.9 % 250 mL IVPB 1,250 mg 166.7 mL/hr   07/03/13 1123 Given   vancomycin (VANCOCIN) 1,250 mg in sodium chloride 0.9 % 250 mL IVPB 1,250 mg 166.7 mL/hr   07/03/13 1439 Given   imipenem-cilastatin (PRIMAXIN) 500 mg in sodium chloride 0.9 % 100 mL IVPB 500 mg 200 mL/hr   07/03/13 1853 Given   vancomycin (VANCOCIN) 1,250 mg in sodium chloride 0.9 % 250 mL IVPB 1,250 mg 166.7 mL/hr   07/03/13 2107 Given  [Multiple IV meds]   imipenem-cilastatin (PRIMAXIN) 500 mg in sodium chloride 0.9 % 100 mL IVPB 500 mg 200 mL/hr   07/04/13 0227 Given   imipenem-cilastatin (PRIMAXIN) 500 mg in sodium chloride 0.9 % 100 mL IVPB 500 mg 200 mL/hr   07/04/13 0318 Given   vancomycin (VANCOCIN) 1,250 mg in sodium chloride 0.9 % 250 mL IVPB 1,250 mg 166.7 mL/hr   07/04/13 0943 Given   imipenem-cilastatin (PRIMAXIN) 500 mg in sodium chloride 0.9 % 100 mL IVPB 500 mg 200 mL/hr   07/04/13 1106 Given   vancomycin (VANCOCIN) 1,250 mg in sodium chloride 0.9 % 250 mL IVPB 1,250 mg 166.7 mL/hr      Subjective: No  complaints, has noted some green discharge from PEG but not new  Objective: Temp:  [98.5 F (36.9 C)-98.6 F (37 C)] 98.6 F (37 C) (12/30 0616) Pulse Rate:  [82-85] 85 (12/30 0616) Resp:  [18-20] 18 (12/30 0616) BP: (104-114)/(47-56) 112/56 mmHg (12/30 0616) SpO2:  [98 %-99 %] 99 % (12/30 0616) Weight:  [231 lb 1.6 oz (104.826 kg)] 231 lb 1.6 oz (104.826 kg) (12/30 0616)  General: Awake, alert, nad Skin: no rashes Lungs: CTA Cor: RRR Abdomen: soft, PEG in place, no erythema, non tender Ext: no edema  Lab Results Lab Results  Component Value Date   WBC 5.9 07/02/2013   HGB 11.6* 07/02/2013   HCT 35.5* 07/02/2013   MCV 87.7 07/02/2013   PLT 165 07/02/2013    Lab Results  Component Value Date   CREATININE 0.78 07/04/2013   BUN 5* 07/04/2013   NA 141 07/04/2013   K 3.8 07/04/2013   CL 104 07/04/2013   CO2 28 07/04/2013    Lab Results  Component Value Date   ALT 9 07/01/2013   AST 14 07/01/2013   ALKPHOS 49 07/01/2013   BILITOT 0.4 07/01/2013      Microbiology: Recent Results (from the past 240 hour(s))  CULTURE, BLOOD (ROUTINE X 2)     Status: None   Collection Time    06/30/13 10:20 AM      Result Value Range Status   Specimen Description BLOOD RT. ARM   Final   Special Requests Normal BOTTLES DRAWN AEROBIC AND ANAEROBIC  10CC   Final   Culture  Setup Time     Final   Value: 06/30/2013 14:26     Performed at [facility]   Culture     Final   Value: STAPHYLOCOCCUS AUREUS     Note: Gram Stain Report Called to,Read Back By and Verified With: [facility] STOWE 07/01/13 AT 0515 RIDK RIFAMPIN AND GENTAMICIN SHOULD NOT BE USED AS SINGLE DRUGS FOR TREATMENT OF STAPH INFECTIONS. This organism DOES NOT demonstrate inducible Clindamycin      resistance in vitro.     GRAM NEGATIVE RODS     Note: Gram Stain Report Called to,Read Back By and Verified With: MARGARET THOMPSON 07/03/13 1155 BY SMITHERSJ     Performed at [facility]   Report Status PENDING    Incomplete   Organism ID, Bacteria STAPHYLOCOCCUS AUREUS   Final  CULTURE, BLOOD (SINGLE)     Status: None   Collection Time    06/30/13 10:40 AM      Result Value Range Status   Specimen Description BLOOD LFT CHEST   Final   Special Requests Normal BOTTLES DRAWN AEROBIC AND ANAEROBIC 5CC   Final   Culture  Setup Time     Final   Value: 06/30/2013 14:26     Performed at [facility]   Culture     Final   Value:        BLOOD CULTURE RECEIVED NO GROWTH TO DATE CULTURE WILL BE HELD FOR 5 DAYS BEFORE ISSUING A FINAL NEGATIVE REPORT     Performed at [facility]   Report Status PENDING   Incomplete  CULTURE, BLOOD (ROUTINE X 2)     Status: None   Collection Time    06/30/13 10:55 AM      Result Value Range Status   Specimen Description BLOOD LEFT AC   Final   Special Requests Normal BOTTLES DRAWN AEROBIC AND ANAEROBIC 5CC   Final   Culture  Setup Time     Final   Value: 06/30/2013 14:26     Performed at [facility]   Culture     Final   Value: STAPHYLOCOCCUS AUREUS     Note: SUSCEPTIBILITIES PERFORMED ON PREVIOUS CULTURE WITHIN THE LAST 5 DAYS.     Note: Gram Stain Report Called to,Read Back By and Verified With: LAUREN HOUT 07/01/13 @ 12:18PM BY RUSCOE A.     Performed at [facility]   Report Status 07/03/2013 FINAL   Final  URINE CULTURE     Status: None   Collection Time    06/30/13 11:15 AM      Result Value Range Status   Specimen Description URINE, SUPRAPUBIC   Final   Special Requests Normal   Final   Culture  Setup Time     Final   Value: 06/30/2013 14:27     Performed at [facility] Count     Final   Value: >=100,000 COLONIES/ML     Performed at [facility]   Culture     Final   Value: CITROBACTER BRAAKII     Performed at [facility]   Report  Status 07/02/2013 FINAL   Final   Organism ID, Bacteria CITROBACTER BRAAKII   Final  MRSA PCR SCREENING     Status: None   Collection Time    06/30/13   3:58 PM      Result Value Range Status   MRSA by PCR NEGATIVE  NEGATIVE Final   Comment:            The GeneXpert MRSA Assay (FDA     approved for NASAL specimens     only), is one component of a     comprehensive MRSA colonization     surveillance program. It is not     intended to diagnose MRSA     infection nor to guide or     monitor treatment for     MRSA infections.  CULTURE, BLOOD (ROUTINE X 2)     Status: None   Collection Time    07/02/13  2:35 PM      Result Value Range Status   Specimen Description BLOOD LEFT ARM   Final   Special Requests BOTTLES DRAWN AEROBIC AND ANAEROBIC 5CC   Final   Culture  Setup Time     Final   Value: 07/02/2013 22:55     Performed at [facility]   Culture     Final   Value:        BLOOD CULTURE RECEIVED NO GROWTH TO DATE CULTURE WILL BE HELD FOR 5 DAYS BEFORE ISSUING A FINAL NEGATIVE REPORT     Performed at [facility]   Report Status PENDING   Incomplete  CULTURE, BLOOD (ROUTINE X 2)     Status: None   Collection Time    07/02/13  2:40 PM      Result Value Range Status   Specimen Description BLOOD RIGHT ARM   Final   Special Requests BOTTLES DRAWN AEROBIC AND ANAEROBIC 5CC   Final   Culture  Setup Time     Final   Value: 07/02/2013 22:55     Performed at [facility]   Culture     Final   Value:        BLOOD CULTURE RECEIVED NO GROWTH TO DATE CULTURE WILL BE HELD FOR 5 DAYS BEFORE ISSUING A FINAL NEGATIVE REPORT     Performed at [facility]   Report Status PENDING   Incomplete    Studies/Results: No results found.  Assessment/Plan: 1) Staph aureus bacteremia line infection - removing Hickman and to place picc.  Repeat blood cultures negative.  TTE not significant for vegetation.   -please do TEE -continue vancomycin -if TEE negative, will need 2 weeks of vancomycin from Hickman removal through January 12th -to leave back to [place] in 1 week.  Should get labs prior to leaving to his physician (cbc,  cmp) -he should get ID follow up around January 12th if possible through [facility].   Will continue to follow  2) GNR bacteremia - not yet speicated.  On imipenem due to allergies, had citrobacter in urine that was cipro int. Continue Imipenem for 1 more day and stop.    Staci Righter, MD [facility] [email or web address] [phone] pager   [phone] cell 07/04/2013, 12:08 PM

## 2013-07-04 NOTE — Procedures (Signed)
Removal of right chest tunneled catheter.  Placement of left arm PICC.  TIp in lower SVC and ready to use.  No immediate complication.  Length = 36.5 cm.

## 2013-07-04 NOTE — Consult Note (Signed)
Reason for Consult: Neurogenic Bladder, UTI  Referring Physician: Jetty Duhamel  Carol [name] is an 32 y.o. female.   HPI:   1 - Neurogenic Bladder with SP Tube - long h/o neurogenic bladder of unknown etiology per report. Used to manage with self-cath, failed interstim, then had Mitrofanoff 2014 that failed, then [facility] catheterizable stoma that failed and partially taken down, finally had SP Tube placed all by primary urologist in [place] 05/31/2013. No problems with tube since placement or in house. [place] today in adequate position.  2 - Bacteruria, Bacteremia - Pt presently admitted for bacteremia / SIRS from suspect infected Hickman catheter. Bacteruria with citrobacter noted, BCX with staph. Renal US today without hydro.  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  Today Carol [name] is seen in consultation for above. He is here visiting family and lives in [place] normally, he plans to return next week and has GU follow-up arranged.   Past Medical History  Diagnosis Date  . Neurogenic bladder   . 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 Chronic pain   . Sepsis   . Asthma   . Colitis, ulcerative chronic     not currently taking any medications, aschol  . Bipolar 1 disorder   . Murmur   . Renal insufficiency   . Chronic pain syndrome 06/30/2013  . Asthma, chronic 06/30/2013  . Bipolar disorder, unspecified 06/30/2013  . Ulcerative colitis, unspecified 06/30/2013    Past Surgical History  Procedure Laterality Date  . Mastectomy Bilateral     transgender surgery  . Insertion of suprapubic catheter    . Bladder surgery    . Knee arthroscopy    . Insertion central venous access device w/ subcutaneous port      History reviewed. No pertinent family history.  Social History:  reports that he has been passively smoking.  He does not have any smokeless tobacco history on file. He reports that he uses illicit drugs  (Marijuana). He reports that he does not drink alcohol.  Allergies:  Allergies  Allergen Reactions  . Cephalosporins Anaphylaxis  . Lovenox [Enoxaparin Sodium] Anaphylaxis  . Sulfa Antibiotics Rash    Medications: I have reviewed the patient's current medications.  Results for orders placed during the hospital encounter of 06/30/13 (from the past 48 hour(s))  BASIC METABOLIC PANEL     Status: Abnormal   Collection Time    07/04/13  5:00 AM      Result Value Range   Sodium 141  137 - 147 mEq/L   Comment: Please note change in reference range.   Potassium 3.8  3.7 - 5.3 mEq/L   Comment: Please note change in reference range.   Chloride 104  96 - 112 mEq/L   CO2 28  19 - 32 mEq/L   Glucose, Bld 77  70 - 99 mg/dL   BUN 5 (*) 6 - 23 mg/dL   Creatinine, Ser 1.91  0.50 - 1.35 mg/dL   Calcium 8.5  8.4 - 47.8 mg/dL   GFR calc non Af Amer >90  >90 mL/min   GFR calc Af Amer >90  >90 mL/min   Comment: (NOTE)     The eGFR has been calculated using the CKD EPI equation.     This calculation has not been validated in all clinical situations.     eGFR's persistently <90 mL/min signify possible Chronic Kidney     Disease.    US Renal  07/04/2013   CLINICAL DATA:  Suprapubic catheter infection  EXAM: RENAL/URINARY TRACT ULTRASOUND COMPLETE  COMPARISON:  None.  FINDINGS: Right Kidney:  Length: 9.3 cm. Echogenicity within normal limits. No mass or hydronephrosis visualized.  Left Kidney:  Length: 9.1 cm. Echogenicity within normal limits. No mass or hydronephrosis visualized.  Bladder:  The bladder is decompressed limiting evaluation. There is a suprapubic catheter present in the expected location of the bladder.  IMPRESSION: Normal renal ultrasound.   Electronically Signed   By: Elige Ko   On: 07/04/2013 19:09   Ir Fluoro Guide Cv Line Left  07/04/2013   CLINICAL DATA:  32 year old with bacteremia. Patient has a tunneled central venous catheter in the chest that needs to be removed.  Request for a PICC line for antibiotic therapy.  EXAM: REMOVAL TUNNELED CENTRAL VENOUS CATHETER; PLACEMENT OF LEFT ARM PICC LINE WITH ULTRASOUND AND FLUOROSCOPIC GUIDANCE  Physician: Rachelle Hora. Henn, MD  MEDICATIONS: None  ANESTHESIA/SEDATION: Moderate sedation time: None  FLUOROSCOPY TIME:  18 seconds  PROCEDURE: The procedure was explained to the patient. The risks and benefits of the procedure were discussed and the patient's questions were addressed. Informed consent was obtained from the patient. The right chest catheter was prepped and draped in a sterile fashion. Maximal barrier sterile technique was utilized including caps, mask, sterile gowns, sterile gloves, sterile drape, hand hygiene and skin antiseptic. The skin was anesthetized with 1% lidocaine. The catheter cuff was exposed and the entire catheter was removed without complication. Occlusive dressing was placed over the catheter exit site.  Attention was directed to the left arm. The patient has patent left brachial veins by ultrasound. Left upper arm was prepped and draped in sterile fashion. Maximal barrier sterile technique was utilized including caps, mask, sterile gowns, sterile gloves, sterile drape, hand hygiene and skin antiseptic. Skin was anesthetized with 1% lidocaine. A 21 gauge needle directed into a brachial vein with ultrasound guidance. A wire was advanced centrally and a peel-away sheath was placed. A dual lumen Power PICC line was cut to 36.5 cm and advanced through the peel-away sheath. Catheter tip placed in the lower SVC at the cavoatrial junction. Both lumens aspirated and flushed well. Catheter was flushed with normal saline. Catheter was sutured to the skin and a dressing was placed. Fluoroscopic and ultrasound images were taken and saved for documentation.  COMPLICATIONS: None  FINDINGS: Successful removal of the tunneled central venous catheter. PICC line tip in the lower SVC.  IMPRESSION: Removal of the tunneled right chest  catheter.  Successful placement of a left arm PICC line. PICC line tip in the lower SVC and ready to be used.   Electronically Signed   By: Richarda Overlie M.D.   On: 07/04/2013 16:23   Ir Removal Tun Cv Cath W/o Fl  07/04/2013   CLINICAL DATA:  32 year old with bacteremia. Patient has a tunneled central venous catheter in the chest that needs to be removed. Request for a PICC line for antibiotic therapy.  EXAM: REMOVAL TUNNELED CENTRAL VENOUS CATHETER; PLACEMENT OF LEFT ARM PICC LINE WITH ULTRASOUND AND FLUOROSCOPIC GUIDANCE  Physician: Rachelle Hora. Henn, MD  MEDICATIONS: None  ANESTHESIA/SEDATION: Moderate sedation time: None  FLUOROSCOPY TIME:  18 seconds  PROCEDURE: The procedure was explained to the patient. The risks and benefits of the procedure were discussed and the patient's questions were addressed. Informed consent was obtained from the patient. The right chest catheter was prepped and draped in a sterile fashion. Maximal barrier sterile technique was  utilized including caps, mask, sterile gowns, sterile gloves, sterile drape, hand hygiene and skin antiseptic. The skin was anesthetized with 1% lidocaine. The catheter cuff was exposed and the entire catheter was removed without complication. Occlusive dressing was placed over the catheter exit site.  Attention was directed to the left arm. The patient has patent left brachial veins by ultrasound. Left upper arm was prepped and draped in sterile fashion. Maximal barrier sterile technique was utilized including caps, mask, sterile gowns, sterile gloves, sterile drape, hand hygiene and skin antiseptic. Skin was anesthetized with 1% lidocaine. A 21 gauge needle directed into a brachial vein with ultrasound guidance. A wire was advanced centrally and a peel-away sheath was placed. A dual lumen Power PICC line was cut to 36.5 cm and advanced through the peel-away sheath. Catheter tip placed in the lower SVC at the cavoatrial junction. Both lumens aspirated and  flushed well. Catheter was flushed with normal saline. Catheter was sutured to the skin and a dressing was placed. Fluoroscopic and ultrasound images were taken and saved for documentation.  COMPLICATIONS: None  FINDINGS: Successful removal of the tunneled central venous catheter. PICC line tip in the lower SVC.  IMPRESSION: Removal of the tunneled right chest catheter.  Successful placement of a left arm PICC line. PICC line tip in the lower SVC and ready to be used.   Electronically Signed   By: Richarda Overlie M.D.   On: 07/04/2013 16:23   Ir US Guide Vasc Access Left  07/04/2013   CLINICAL DATA:  32 year old with bacteremia. Patient has a tunneled central venous catheter in the chest that needs to be removed. Request for a PICC line for antibiotic therapy.  EXAM: REMOVAL TUNNELED CENTRAL VENOUS CATHETER; PLACEMENT OF LEFT ARM PICC LINE WITH ULTRASOUND AND FLUOROSCOPIC GUIDANCE  Physician: Rachelle Hora. Henn, MD  MEDICATIONS: None  ANESTHESIA/SEDATION: Moderate sedation time: None  FLUOROSCOPY TIME:  18 seconds  PROCEDURE: The procedure was explained to the patient. The risks and benefits of the procedure were discussed and the patient's questions were addressed. Informed consent was obtained from the patient. The right chest catheter was prepped and draped in a sterile fashion. Maximal barrier sterile technique was utilized including caps, mask, sterile gowns, sterile gloves, sterile drape, hand hygiene and skin antiseptic. The skin was anesthetized with 1% lidocaine. The catheter cuff was exposed and the entire catheter was removed without complication. Occlusive dressing was placed over the catheter exit site.  Attention was directed to the left arm. The patient has patent left brachial veins by ultrasound. Left upper arm was prepped and draped in sterile fashion. Maximal barrier sterile technique was utilized including caps, mask, sterile gowns, sterile gloves, sterile drape, hand hygiene and skin antiseptic. Skin  was anesthetized with 1% lidocaine. A 21 gauge needle directed into a brachial vein with ultrasound guidance. A wire was advanced centrally and a peel-away sheath was placed. A dual lumen Power PICC line was cut to 36.5 cm and advanced through the peel-away sheath. Catheter tip placed in the lower SVC at the cavoatrial junction. Both lumens aspirated and flushed well. Catheter was flushed with normal saline. Catheter was sutured to the skin and a dressing was placed. Fluoroscopic and ultrasound images were taken and saved for documentation.  COMPLICATIONS: None  FINDINGS: Successful removal of the tunneled central venous catheter. PICC line tip in the lower SVC.  IMPRESSION: Removal of the tunneled right chest catheter.  Successful placement of a left arm PICC line. PICC line tip in  the lower SVC and ready to be used.   Electronically Signed   By: Richarda Overlie M.D.   On: 07/04/2013 16:23    Review of Systems  Constitutional: Positive for fever. Negative for chills.  HENT: Negative.   Eyes: Negative.   Respiratory: Negative.   Cardiovascular: Negative.   Gastrointestinal: Negative.  Negative for nausea and vomiting.  Genitourinary: Negative for hematuria and flank pain.  Musculoskeletal: Negative.   Skin: Negative.   Neurological: Negative.   Endo/Heme/Allergies: Negative.   Psychiatric/Behavioral: Negative.    Blood pressure 114/66, pulse 72, temperature 99 F (37.2 C), temperature source Oral, resp. rate 20, height 5\' 5"  (1.651 m), weight 104.826 kg (231 lb 1.6 oz), SpO2 100.00%. Physical Exam  Constitutional: He is oriented to person, place, and time. He appears well-developed and well-nourished.  HENT:  Head: Normocephalic.  Eyes: Pupils are equal, round, and reactive to light.  Neck: Normal range of motion. Neck supple.  Cardiovascular: Normal rate and regular rhythm.   Respiratory: Effort normal.  GI: Soft. Bowel sounds are normal.  SPT c/d/i with clear urine, no debris. No gross  succus through or around site. Multiple abd scars c/d/i. No hernias  Genitourinary:  Nl female external genetalia  Musculoskeletal: Normal range of motion.  Neurological: He is alert and oriented to person, place, and time.  Skin: Skin is warm and dry.  Psychiatric: He has a normal mood and affect. His behavior is normal. Judgment and thought content normal.    Assessment/Plan:  1 - Neurogenic Bladder with SP Tube - current SPT in adequate position and functioning well. I prefer to have his primary urologist in [place] change this as pt has incredibly complex GU history and anatomy and this is his first tube change. Should any issues arise from this elective tube change it is most prudent for MD with best understanding of his specific anatomy to perform. No urgent need for change this admission.  2 - Bacteruria, Bacteremia - Renal US w/o hydro, GFR normal, no s/s reversible causes for urinary seeding of blood by imaging and exam. Agree with empric ABX focused on coverage of his gram positive and gram negative organisms.  3 - Pt to f/u as scheduled with his urologist in [place], call with questions.  Carol [name] 07/04/2013, 7:46 PM

## 2013-07-04 NOTE — Care Management Note (Signed)
CARE MANAGEMENT NOTE 07/04/2013  Patient:  Carol [name],Carol [name]   Account Number:  [id number]  Date Initiated:  07/04/2013  Documentation initiated by:  [facility]  Subjective/Objective Assessment:   admitted with fever, having Hickman cath removed and PICC inserted     Action/Plan:   plan home IV antibiotics in [place]   Anticipated DC Date:  07/05/2013   Anticipated DC Plan:  HOME W HOME HEALTH SERVICES      DC Planning Services  CM consult      [facility] Choice  HOME HEALTH   Choice offered to / List presented to:  C-1 Patient        HH arranged  HH-2 PT  HH-1 RN      [facility] agency  OTHER - SEE NOTE   Status of service:  In process, will continue to follow Medicare Important Message given?   (If response is "NO", the following Medicare IM given date fields will be blank) Date Medicare IM given:   Date Additional Medicare IM given:    Discharge Disposition:    Per UR Regulation:    If discussed at Long Length of Stay Meetings, dates discussed:    Comments:  07/04/13 11:00 AM Vance Peper, RN BSN Case Manager CM spoke with patient concerning his home health needs at discharge.Patient will need HH RN for PICC line care and IV antibiotics. HH PT as well. Patient lives in [place], [place]. and has used [facility] services in the past. CM contacted [facility] [address] [facility], [address] [place] - [phone], Fax [phone]. Faxed H&P and demographics. CM also contacted [facility] to handle antibitoics. Spoke with Tresa Endo @[phone] faxed demographics to her at [phone]. Will send both agencies IV med orders when available.

## 2013-07-04 NOTE — Progress Notes (Addendum)
[facility] Progress Note    Marjo Akopyan MRN:[id number] DOB: 11/07/1980 DOA: 06/30/2013 PCP: No PCP Per Patient  Admit HPI / Brief Narrative: 32 y.o. female (transgender female to female) with a history of neurogenic bladder and lower extremity weakness. He lived with a chronic indwelling Foley catheter for many years but earlier this year underwent a procedure to construct an ileal urostomy. Apparently the procedure did not work and the urostomy had to be reversed. He had a suprapubic catheter placed at that time in July. He had a VAC wound dressing for several weeks. Since that time his chronic nausea and vomiting and constipation worsened. This required placement of a Hickman catheter for IV fluids. Since July he has had 3 episodes of fever that has led to hospitalization in [place] at [facility]. He says that his episodes have been described as SIRS. He states that his cultures have always been negative. He would be placed on antibiotics and improve within 48 hours. Blood cultures returned negative the antibiotics have generally been stopped and he would be discharged home. He has had negative abdominal scans the did not reveal any evidence of intra-abdominal infection. He states that he has lost about 50 pounds unintentionally since his last surgery in July. He is visiting family here for the holidays and developed a fever up to 104 yesterday leading to admission here.  HPI/Subjective: Pt seen s/p PICC line placement, requesting suprapubic cath change  Assessment/Plan:  Sepsis (HR 104, Temp 100.9 + UTI + positive blood cx), urine with Citrobacter and blood culture with staph aureus and GNR -Blood cultures with oxacillin sensitive staph aureus, and also now with GNR -appreciate the assistance follow for further recommendations -continue imipenem(started 12/29) and vanc  -s/p Hickman cath removal and PICC placement today per IR -Pt discussed with Dr Comer and I  Have  consulted [facility] for TEE, echo with no reports of vegetations   Gram negative rod UTI Blood culture now also with GNR 12/29, so this is likely not chronic colonization or contamination>> imipemen 12/29started per ID as above   Staph aureus in blood cx Worrisome for catheter related smoldering infection, or even SBE - ID following - remains on Vanc - oxacillin sens  Relapsing fevers Patient has had 3 episodes of fevers between July 2014 & now with unknown etiology - as per discussion above   Neurogenic bladder Has suprapubic cath s/p failed illeal conduit -consulted urology for suprapbic cath change and Dr Manny requests US which I have ordered.  Asthma Well compensated  Bipolar   Chronic pain syndrome Pain currently well controlled   Chronic nausea  Ulcerative colitis  Has been quiescent for years per his report  Constipation -Continue current bowel regimen, as above patient does not want any enema time. Obesity - Body mass index is 38.46 kg/(m^2).  Code Status: FULL Family Communication: no family at bedside Disposition Plan: to home when medically stable  Consultants: ID  Procedures: S/p Hickman removal 12/30 PICC placement 12/30 Study Conclusions  - Left ventricle: The cavity size was normal. Systolic function was normal. The estimated ejection fraction was in the range of 60% to 65%. Wall motion was normal; there were no regional wall motion abnormalities. - Atrial septum: The septum bowed from left to right, consistent with increased left atrial pressure. There was an atrial septal aneurysm.  Antibiotics: Aztreonam 12/26 >>12/28 Levofloxacin 12/26>>12/26 Vancomycin 12/26 >> Imipenem 12/29>>  DVT prophylaxis: SCDs only - reported "anayphylaxis" to lovenox   Objective:   Blood pressure 105/60, pulse 75, temperature 98.5 F (36.9 C), temperature source Oral, resp. rate 18, height 5' 5" (1.651 m), weight 104.826 kg (231 lb 1.6 oz), SpO2  98.00%.  Intake/Output Summary (Last 24 hours) at 07/04/13 1513 Last data filed at 07/04/13 1301  Gross per 24 hour  Intake      0 ml  Output   3900 ml  Net  -3900 ml   Exam: General: No acute respiratory distress Chest:  S/p removal of Hickman catheter, PICC present LUE Lungs: Clear to auscultation bilaterally without wheezes or crackles Cardiovascular: Regular rate and rhythm without murmur gallop or rub normal S1 and S2 Abdomen: Nontender, nondistended, soft, bowel sounds positive, no rebound, no ascites, no appreciable mass Extremities: No significant cyanosis, clubbing, or edema bilateral lower extremities  Data Reviewed: Basic Metabolic Panel:  Recent Labs Lab 06/30/13 1020 06/30/13 1634 07/01/13 0425 07/04/13 0500  NA 136  --  136 141  K 4.1  --  3.7 3.8  CL 101  --  98 104  CO2 25  --  29 28  GLUCOSE 105*  --  126* 77  BUN 7  --  6 5*  CREATININE 1.00  --  0.94 0.78  CALCIUM 8.9  --  8.0* 8.5  MG  --  1.6  --   --    Liver Function Tests:  Recent Labs Lab 06/30/13 1020 07/01/13 0425  AST 14 14  ALT 12 9  ALKPHOS 61 49  BILITOT 0.6 0.4  PROT 6.5 5.3*  ALBUMIN 3.9 2.9*   CBC:  Recent Labs Lab 06/30/13 1020 07/01/13 0425 07/02/13 0500  WBC 7.9 5.5 5.9  NEUTROABS 6.3  --   --   HGB 13.2 11.7* 11.6*  HCT 39.0 35.2* 35.5*  MCV 85.5 87.3 87.7  PLT 199 165 165    Recent Results (from the past 240 hour(s))  CULTURE, BLOOD (ROUTINE X 2)     Status: None   Collection Time    06/30/13 10:20 AM      Result Value Range Status   Specimen Description BLOOD RT. ARM   Final   Special Requests Normal BOTTLES DRAWN AEROBIC AND ANAEROBIC  10CC   Final   Culture  Setup Time     Final   Value: 06/30/2013 14:26     Performed at [facility]   Culture     Final   Value: STAPHYLOCOCCUS AUREUS     Note: Gram Stain Report Called to,Read Back By and Verified With: SHANNA STOWE 07/01/13 AT 0515 RIDK RIFAMPIN AND GENTAMICIN SHOULD NOT BE USED AS SINGLE  DRUGS FOR TREATMENT OF STAPH INFECTIONS. This organism DOES NOT demonstrate inducible Clindamycin      resistance in vitro.     GRAM NEGATIVE RODS     Note: Gram Stain Report Called to,Read Back By and Verified With: MARGARET THOMPSON 07/03/13 1155 BY SMITHERSJ     Performed at [facility]   Report Status PENDING   Incomplete   Organism ID, Bacteria STAPHYLOCOCCUS AUREUS   Final  CULTURE, BLOOD (SINGLE)     Status: None   Collection Time    06/30/13 10:40 AM      Result Value Range Status   Specimen Description BLOOD LFT CHEST   Final   Special Requests Normal BOTTLES DRAWN AEROBIC AND ANAEROBIC 5CC   Final   Culture  Setup Time     Final   Value: 06/30/2013 14:26     Performed at   [facility]   Culture     Final   Value:        BLOOD CULTURE RECEIVED NO GROWTH TO DATE CULTURE WILL BE HELD FOR 5 DAYS BEFORE ISSUING A FINAL NEGATIVE REPORT     Performed at [facility]   Report Status PENDING   Incomplete  CULTURE, BLOOD (ROUTINE X 2)     Status: None   Collection Time    06/30/13 10:55 AM      Result Value Range Status   Specimen Description BLOOD LEFT AC   Final   Special Requests Normal BOTTLES DRAWN AEROBIC AND ANAEROBIC 5CC   Final   Culture  Setup Time     Final   Value: 06/30/2013 14:26     Performed at [facility]   Culture     Final   Value: STAPHYLOCOCCUS AUREUS     Note: SUSCEPTIBILITIES PERFORMED ON PREVIOUS CULTURE WITHIN THE LAST 5 DAYS.     Note: Gram Stain Report Called to,Read Back By and Verified With: LAUREN HOUT 07/01/13 @ 12:18PM BY RUSCOE A.     Performed at [facility]   Report Status 07/03/2013 FINAL   Final  URINE CULTURE     Status: None   Collection Time    06/30/13 11:15 AM      Result Value Range Status   Specimen Description URINE, SUPRAPUBIC   Final   Special Requests Normal   Final   Culture  Setup Time     Final   Value: 06/30/2013 14:27     Performed at [facility]   Colony Count     Final    Value: >=100,000 COLONIES/ML     Performed at [facility]   Culture     Final   Value: CITROBACTER BRAAKII     Performed at [facility]   Report Status 07/02/2013 FINAL   Final   Organism ID, Bacteria CITROBACTER BRAAKII   Final  MRSA PCR SCREENING     Status: None   Collection Time    06/30/13  3:58 PM      Result Value Range Status   MRSA by PCR NEGATIVE  NEGATIVE Final   Comment:            The GeneXpert MRSA Assay (FDA     approved for NASAL specimens     only), is one component of a     comprehensive MRSA colonization     surveillance program. It is not     intended to diagnose MRSA     infection nor to guide or     monitor treatment for     MRSA infections.  CULTURE, BLOOD (ROUTINE X 2)     Status: None   Collection Time    07/02/13  2:35 PM      Result Value Range Status   Specimen Description BLOOD LEFT ARM   Final   Special Requests BOTTLES DRAWN AEROBIC AND ANAEROBIC 5CC   Final   Culture  Setup Time     Final   Value: 07/02/2013 22:55     Performed at [facility]   Culture     Final   Value:        BLOOD CULTURE RECEIVED NO GROWTH TO DATE CULTURE WILL BE HELD FOR 5 DAYS BEFORE ISSUING A FINAL NEGATIVE REPORT     Performed at [facility]   Report Status PENDING   Incomplete    CULTURE, BLOOD (ROUTINE X 2)     Status: None   Collection Time    07/02/13  2:40 PM      Result Value Range Status   Specimen Description BLOOD RIGHT ARM   Final   Special Requests BOTTLES DRAWN AEROBIC AND ANAEROBIC 5CC   Final   Culture  Setup Time     Final   Value: 07/02/2013 22:55     Performed at [facility]   Culture     Final   Value:        BLOOD CULTURE RECEIVED NO GROWTH TO DATE CULTURE WILL BE HELD FOR 5 DAYS BEFORE ISSUING A FINAL NEGATIVE REPORT     Performed at [facility]   Report Status PENDING   Incomplete     Studies:  I have Reviewed all Recent x-ray studies in detail   Scheduled Meds:  Scheduled  Meds: . midazolam      . buPROPion  300 mg Oral Daily  . chlorhexidine      . docusate sodium  100 mg Oral BID  . dronabinol  10 mg Oral TID AC & HS  . feeding supplement (RESOURCE BREEZE)  1 Container Oral BID BM  . fentaNYL  12.5 mcg Transdermal Q72H  . fentaNYL  50 mcg Transdermal Q72H  . imipenem-cilastatin  500 mg Intravenous Q6H  . lithium carbonate  900 mg Oral QHS  . multivitamin with minerals  1 tablet Oral Daily  . oxybutynin  5 mg Oral BID  . oxyCODONE  15 mg Oral 5 X Daily  . polyethylene glycol  17 g Oral Daily  . [START ON 07/08/2013] testosterone enanthate  140 mg Intramuscular Q14 Days  . traZODone  100 mg Oral QHS  . vancomycin  1,250 mg Intravenous Q8H    Time spent on care of this patient: 35 mins   Kurstyn Larios C [phone] [facility] Office  [phone] Pager - Text Page per Amion as per below:  On-Call/Text Page:      [email or web address]      password [email or web address]  If 7PM-7AM, please contact night-coverage [email or web address] Password [email or web address] 07/04/2013, 3:13 PM   LOS: 4 days       

## 2013-07-04 NOTE — Progress Notes (Signed)
Physical Therapy Treatment Patient Details Name: Carol [name] MRN: [id number] DOB: [birth year] Today's Date: 07/04/2013 Time: 7829-5621 PT Time Calculation (min): 11 min  PT Assessment / Plan / Recommendation  History of Present Illness Pt admit for SIRS.  History of RSD.     PT Comments   Pt moving well.  Ambulated with loftstrand crutches today.  Pt reports he feels more comfortable with crutches than he does RW.  Demonstrated safe use of crutches.     Follow Up Recommendations  Home health PT;Supervision for mobility/OOB     Does the patient have the potential to tolerate intense rehabilitation     Barriers to Discharge        Equipment Recommendations       Recommendations for Other Services    Frequency Min 3X/week   Progress towards PT Goals Progress towards PT goals: Progressing toward goals  Plan Current plan remains appropriate    Precautions / Restrictions Precautions Precautions: Fall Restrictions Weight Bearing Restrictions: No   Pertinent Vitals/Pain "just the normal pain".      Mobility  Bed Mobility Bed Mobility: Supine to Sit;Sitting - Scoot to Edge of Bed Supine to Sit: 6: Modified independent (Device/Increase time) Sitting - Scoot to Edge of Bed: 6: Modified independent (Device/Increase time) Transfers Transfers: Sit to Stand;Stand to Sit Sit to Stand: 5: Supervision;With upper extremity assist;From bed Stand to Sit: 5: Supervision;With upper extremity assist;To bed Details for Transfer Assistance: Pt demonstrated safe use of loftstrand crutches to achieve standing Ambulation/Gait Ambulation/Gait Assistance: 4: Min guard Ambulation Distance (Feet): 120 Feet Assistive device: Lofstrands Ambulation/Gait Assistance Details: Pt demonstrated safe use of loftstrands & he states he feels more comfortable with them than he does with RW.   Gait Pattern: Step-through pattern;Decreased stride length Gait velocity: Decreased General Gait Details: Pt walking  on outside of feet holding arches and big toes up in the air. Pt able to flatten feet out with cueing.  Stairs: No Wheelchair Mobility Wheelchair Mobility: No      PT Goals (current goals can now be found in the care plan section) Acute Rehab PT Goals Patient Stated Goal: to go home PT Goal Formulation: With patient Time For Goal Achievement: 07/08/13 Potential to Achieve Goals: Good  Visit Information  Last PT Received On: 07/04/13 Assistance Needed: +1 History of Present Illness: Pt admit for SIRS.  History of RSD.      Subjective Data  Patient Stated Goal: to go home   Cognition  Cognition Arousal/Alertness: Awake/alert Behavior During Therapy: WFL for tasks assessed/performed Overall Cognitive Status: Within Functional Limits for tasks assessed    Balance     End of Session PT - End of Session Activity Tolerance: Patient tolerated treatment well Patient left: Other (comment);with call bell/phone within reach (sitting EOB per pt's request) Nurse Communication: Mobility status   GP     Lara Mulch 07/04/2013, 2:28 PM   Verdell Face, PTA [phone] 07/04/2013

## 2013-07-04 NOTE — Progress Notes (Signed)
OT Cancellation Note  Patient Details Name: Carol [name] MRN: [id number] DOB: Apr 27, 1981   Cancelled Treatment:    Reason Eval/Treat Not Completed: Patient at procedure or test/ unavailable.    Jiovanna Frei 07/04/2013, 2:58 PM

## 2013-07-05 ENCOUNTER — Inpatient Hospital Stay (HOSPITAL_COMMUNITY): Admission: RE | Admit: 2013-07-05 | Payer: Medicare (Managed Care) | Source: Ambulatory Visit | Admitting: Cardiology

## 2013-07-05 ENCOUNTER — Encounter (HOSPITAL_COMMUNITY)
Admission: EM | Disposition: A | Discharge status: Home / Self Care | Payer: Self-pay | Source: Home / Self Care | Attending: Internal Medicine

## 2013-07-05 ENCOUNTER — Encounter (HOSPITAL_COMMUNITY): Payer: Self-pay | Admitting: *Deleted

## 2013-07-05 DIAGNOSIS — I339 Acute and subacute endocarditis, unspecified: Secondary | ICD-10-CM

## 2013-07-05 HISTORY — PX: TEE WITHOUT CARDIOVERSION: SHX5443

## 2013-07-05 SURGERY — ECHOCARDIOGRAM, TRANSESOPHAGEAL
Anesthesia: Moderate Sedation

## 2013-07-05 MED ORDER — FENTANYL CITRATE 0.05 MG/ML IJ SOLN
INTRAMUSCULAR | Status: AC
  Filled 2013-07-05: qty 4

## 2013-07-05 MED ORDER — FENTANYL 50 MCG/HR TD PT72
75.0000 ug | MEDICATED_PATCH | TRANSDERMAL | Status: DC
  Administered 2013-07-05: 75 ug via TRANSDERMAL
  Filled 2013-07-05 (×2): qty 1

## 2013-07-05 MED ORDER — DIPHENHYDRAMINE HCL 50 MG/ML IJ SOLN
INTRAMUSCULAR | Status: AC
  Filled 2013-07-05: qty 1

## 2013-07-05 MED ORDER — FENTANYL CITRATE 0.05 MG/ML IJ SOLN
INTRAMUSCULAR | Status: DC | PRN
  Administered 2013-07-05 (×4): 25 ug via INTRAVENOUS

## 2013-07-05 MED ORDER — ONDANSETRON HCL 4 MG/2ML IJ SOLN
INTRAMUSCULAR | Status: AC
  Filled 2013-07-05: qty 2

## 2013-07-05 MED ORDER — FENTANYL 12 MCG/HR TD PT72: 12.5000 ug | MEDICATED_PATCH | TRANSDERMAL | Status: DC

## 2013-07-05 MED ORDER — DIPHENHYDRAMINE HCL 50 MG/ML IJ SOLN
INTRAMUSCULAR | Status: DC | PRN
  Administered 2013-07-05: 25 mg via INTRAVENOUS

## 2013-07-05 MED ORDER — BUTAMBEN-TETRACAINE-BENZOCAINE 2-2-14 % EX AERO
INHALATION_SPRAY | CUTANEOUS | Status: DC | PRN
  Administered 2013-07-05: 2 via TOPICAL

## 2013-07-05 MED ORDER — MIDAZOLAM HCL 10 MG/2ML IJ SOLN
INTRAMUSCULAR | Status: DC | PRN
  Administered 2013-07-05: 1 mg via INTRAVENOUS
  Administered 2013-07-05: 2 mg via INTRAVENOUS
  Administered 2013-07-05: 1 mg via INTRAVENOUS
  Administered 2013-07-05 (×3): 2 mg via INTRAVENOUS

## 2013-07-05 MED ORDER — MORPHINE SULFATE 2 MG/ML IJ SOLN
1.0000 mg | Freq: Once | INTRAMUSCULAR | Status: AC
  Administered 2013-07-05: 1 mg via INTRAVENOUS
  Filled 2013-07-05: qty 1

## 2013-07-05 MED ORDER — MIDAZOLAM HCL 5 MG/ML IJ SOLN
INTRAMUSCULAR | Status: AC
  Filled 2013-07-05: qty 3

## 2013-07-05 MED ORDER — ONDANSETRON HCL 4 MG/2ML IJ SOLN
4.0000 mg | Freq: Once | INTRAMUSCULAR | Status: AC
  Administered 2013-07-05: 4 mg via INTRAVENOUS

## 2013-07-05 MED ORDER — FENTANYL 50 MCG/HR TD PT72: 50.0000 ug | MEDICATED_PATCH | TRANSDERMAL | Status: DC

## 2013-07-05 NOTE — H&P (View-Only) (Signed)
[facility] Progress Note    Carol [name] WNU:[id number] DOB: [birth year] DOA: 06/30/2013 PCP: No PCP Per Patient  Admit HPI / Brief Narrative: 32 y.o. female (transgender female to female) with a history of neurogenic bladder and lower extremity weakness. He lived with a chronic indwelling Foley catheter for many years but earlier this year underwent a procedure to construct an ileal urostomy. Apparently the procedure did not work and the urostomy had to be reversed. He had a suprapubic catheter placed at that time in July. He had a VAC wound dressing for several weeks. Since that time his chronic nausea and vomiting and constipation worsened. This required placement of a Hickman catheter for IV fluids. Since July he has had 3 episodes of fever that has led to hospitalization in [place] at [facility]. He says that his episodes have been described as SIRS. He states that his cultures have always been negative. He would be placed on antibiotics and improve within 48 hours. Blood cultures returned negative the antibiotics have generally been stopped and he would be discharged home. He has had negative abdominal scans the did not reveal any evidence of intra-abdominal infection. He states that he has lost about 50 pounds unintentionally since his last surgery in July. He is visiting family here for the holidays and developed a fever up to 104 yesterday leading to admission here.  HPI/Subjective: Pt seen s/p PICC line placement, requesting suprapubic cath change  Assessment/Plan:  Sepsis (HR 104, Temp 100.9 + UTI + positive blood cx), urine with Citrobacter and blood culture with staph aureus and GNR -Blood cultures with oxacillin sensitive staph aureus, and also now with GNR -appreciate the assistance follow for further recommendations -continue imipenem(started 12/29) and vanc  -s/p Hickman cath removal and PICC placement today per IR -Pt discussed with Dr Luciana Axe and I  Have  consulted [facility] for TEE, echo with no reports of vegetations   Gram negative rod UTI Blood culture now also with GNR 12/29, so this is likely not chronic colonization or contamination>> imipemen 12/29started per ID as above   Staph aureus in blood cx Worrisome for catheter related smoldering infection, or even SBE - ID following - remains on Vanc - oxacillin sens  Relapsing fevers Patient has had 3 episodes of fevers between July 2014 & now with unknown etiology - as per discussion above   Neurogenic bladder Has suprapubic cath s/p failed illeal conduit -consulted urology for suprapbic cath change and Dr Berneice Heinrich requests [place] which I have ordered.  Asthma Well compensated  Bipolar   Chronic pain syndrome Pain currently well controlled   Chronic nausea  Ulcerative colitis  Has been quiescent for years per his report  Constipation -Continue current bowel regimen, as above patient does not want any enema time. Obesity - Body mass index is 38.46 kg/(m^2).  Code Status: FULL Family Communication: no family at bedside Disposition Plan: to home when medically stable  Consultants: ID  Procedures: S/p Hickman removal 12/30 PICC placement 12/30 Study Conclusions  - Left ventricle: The cavity size was normal. Systolic function was normal. The estimated ejection fraction was in the range of 60% to 65%. Wall motion was normal; there were no regional wall motion abnormalities. - Atrial septum: The septum bowed from left to right, consistent with increased left atrial pressure. There was an atrial septal aneurysm.  Antibiotics: Aztreonam 12/26 >>12/28 Levofloxacin 12/26>>12/26 Vancomycin 12/26 >> Imipenem 12/29>>  DVT prophylaxis: SCDs only - reported "anayphylaxis" to lovenox   Objective:  Blood pressure 105/60, pulse 75, temperature 98.5 F (36.9 C), temperature source Oral, resp. rate 18, height 5\' 5"  (1.651 m), weight 104.826 kg (231 lb 1.6 oz), SpO2  98.00%.  Intake/Output Summary (Last 24 hours) at 07/04/13 1513 Last data filed at 07/04/13 1301  Gross per 24 hour  Intake      0 ml  Output   3900 ml  Net  -3900 ml   Exam: General: No acute respiratory distress Chest:  S/p removal of Hickman catheter, PICC present LUE Lungs: Clear to auscultation bilaterally without wheezes or crackles Cardiovascular: Regular rate and rhythm without murmur gallop or rub normal S1 and S2 Abdomen: Nontender, nondistended, soft, bowel sounds positive, no rebound, no ascites, no appreciable mass Extremities: No significant cyanosis, clubbing, or edema bilateral lower extremities  Data Reviewed: Basic Metabolic Panel:  Recent Labs Lab 06/30/13 1020 06/30/13 1634 07/01/13 0425 07/04/13 0500  NA 136  --  136 141  K 4.1  --  3.7 3.8  CL 101  --  98 104  CO2 25  --  29 28  GLUCOSE 105*  --  126* 77  BUN 7  --  6 5*  CREATININE 1.00  --  0.94 0.78  CALCIUM 8.9  --  8.0* 8.5  MG  --  1.6  --   --    Liver Function Tests:  Recent Labs Lab 06/30/13 1020 07/01/13 0425  AST 14 14  ALT 12 9  ALKPHOS 61 49  BILITOT 0.6 0.4  PROT 6.5 5.3*  ALBUMIN 3.9 2.9*   CBC:  Recent Labs Lab 06/30/13 1020 07/01/13 0425 07/02/13 0500  WBC 7.9 5.5 5.9  NEUTROABS 6.3  --   --   HGB 13.2 11.7* 11.6*  HCT 39.0 35.2* 35.5*  MCV 85.5 87.3 87.7  PLT 199 165 165    Recent Results (from the past 240 hour(s))  CULTURE, BLOOD (ROUTINE X 2)     Status: None   Collection Time    06/30/13 10:20 AM      Result Value Range Status   Specimen Description BLOOD RT. ARM   Final   Special Requests Normal BOTTLES DRAWN AEROBIC AND ANAEROBIC  10CC   Final   Culture  Setup Time     Final   Value: 06/30/2013 14:26     Performed at [facility]   Culture     Final   Value: STAPHYLOCOCCUS AUREUS     Note: Gram Stain Report Called to,Read Back By and Verified With: [facility] STOWE 07/01/13 AT 0515 RIDK RIFAMPIN AND GENTAMICIN SHOULD NOT BE USED AS SINGLE  DRUGS FOR TREATMENT OF STAPH INFECTIONS. This organism DOES NOT demonstrate inducible Clindamycin      resistance in vitro.     GRAM NEGATIVE RODS     Note: Gram Stain Report Called to,Read Back By and Verified With: MARGARET THOMPSON 07/03/13 1155 BY SMITHERSJ     Performed at [facility]   Report Status PENDING   Incomplete   Organism ID, Bacteria STAPHYLOCOCCUS AUREUS   Final  CULTURE, BLOOD (SINGLE)     Status: None   Collection Time    06/30/13 10:40 AM      Result Value Range Status   Specimen Description BLOOD LFT CHEST   Final   Special Requests Normal BOTTLES DRAWN AEROBIC AND ANAEROBIC 5CC   Final   Culture  Setup Time     Final   Value: 06/30/2013 14:26     Performed at  [facility]     Final   Value:        BLOOD CULTURE RECEIVED NO GROWTH TO DATE CULTURE WILL BE HELD FOR 5 DAYS BEFORE ISSUING A FINAL NEGATIVE REPORT     Performed at [facility]   Report Status PENDING   Incomplete  CULTURE, BLOOD (ROUTINE X 2)     Status: None   Collection Time    06/30/13 10:55 AM      Result Value Range Status   Specimen Description BLOOD LEFT AC   Final   Special Requests Normal BOTTLES DRAWN AEROBIC AND ANAEROBIC 5CC   Final   Culture  Setup Time     Final   Value: 06/30/2013 14:26     Performed at [facility]   Culture     Final   Value: STAPHYLOCOCCUS AUREUS     Note: SUSCEPTIBILITIES PERFORMED ON PREVIOUS CULTURE WITHIN THE LAST 5 DAYS.     Note: Gram Stain Report Called to,Read Back By and Verified With: LAUREN HOUT 07/01/13 @ 12:18PM BY RUSCOE A.     Performed at [facility]   Report Status 07/03/2013 FINAL   Final  URINE CULTURE     Status: None   Collection Time    06/30/13 11:15 AM      Result Value Range Status   Specimen Description URINE, SUPRAPUBIC   Final   Special Requests Normal   Final   Culture  Setup Time     Final   Value: 06/30/2013 14:27     Performed at [facility] Count     Final    Value: >=100,000 COLONIES/ML     Performed at [facility]   Culture     Final   Value: [place]     Performed at [facility]   Report Status 07/02/2013 FINAL   Final   Organism ID, Bacteria CITROBACTER BRAAKII   Final  MRSA PCR SCREENING     Status: None   Collection Time    06/30/13  3:58 PM      Result Value Range Status   MRSA by PCR NEGATIVE  NEGATIVE Final   Comment:            The GeneXpert MRSA Assay (FDA     approved for NASAL specimens     only), is one component of a     comprehensive MRSA colonization     surveillance program. It is not     intended to diagnose MRSA     infection nor to guide or     monitor treatment for     MRSA infections.  CULTURE, BLOOD (ROUTINE X 2)     Status: None   Collection Time    07/02/13  2:35 PM      Result Value Range Status   Specimen Description BLOOD LEFT ARM   Final   Special Requests BOTTLES DRAWN AEROBIC AND ANAEROBIC 5CC   Final   Culture  Setup Time     Final   Value: 07/02/2013 22:55     Performed at [facility]   Culture     Final   Value:        BLOOD CULTURE RECEIVED NO GROWTH TO DATE CULTURE WILL BE HELD FOR 5 DAYS BEFORE ISSUING A FINAL NEGATIVE REPORT     Performed at [facility]   Report Status PENDING   Incomplete  CULTURE, BLOOD (ROUTINE X 2)     Status: None   Collection Time    07/02/13  2:40 PM      Result Value Range Status   Specimen Description BLOOD RIGHT ARM   Final   Special Requests BOTTLES DRAWN AEROBIC AND ANAEROBIC 5CC   Final   Culture  Setup Time     Final   Value: 07/02/2013 22:55     Performed at [facility]   Culture     Final   Value:        BLOOD CULTURE RECEIVED NO GROWTH TO DATE CULTURE WILL BE HELD FOR 5 DAYS BEFORE ISSUING A FINAL NEGATIVE REPORT     Performed at [facility]   Report Status PENDING   Incomplete     Studies:  I have Reviewed all Recent x-ray studies in detail   Scheduled Meds:  Scheduled  Meds: . midazolam      . buPROPion  300 mg Oral Daily  . chlorhexidine      . docusate sodium  100 mg Oral BID  . dronabinol  10 mg Oral TID AC & HS  . feeding supplement (RESOURCE BREEZE)  1 Container Oral BID BM  . fentaNYL  12.5 mcg Transdermal Q72H  . fentaNYL  50 mcg Transdermal Q72H  . imipenem-cilastatin  500 mg Intravenous Q6H  . lithium carbonate  900 mg Oral QHS  . multivitamin with minerals  1 tablet Oral Daily  . oxybutynin  5 mg Oral BID  . oxyCODONE  15 mg Oral 5 X Daily  . polyethylene glycol  17 g Oral Daily  . [START ON 07/08/2013] testosterone enanthate  140 mg Intramuscular Q14 Days  . traZODone  100 mg Oral QHS  . vancomycin  1,250 mg Intravenous Q8H    Time spent on care of this patient: 35 mins   Kela Millin [phone] [facility] Office  [phone] Pager - Text Page per Loretha Stapler as per below:  On-Call/Text Page:      [email or web address]      password [email or web address]  If 7PM-7AM, please contact night-coverage [email or web address] Password [email or web address] 07/04/2013, 3:13 PM   LOS: 4 days

## 2013-07-05 NOTE — Interval H&P Note (Signed)
History and Physical Interval Note:  07/05/2013 1:59 PM  Carol [name]  has presented today for surgery, with the diagnosis of FEVER  The various methods of treatment have been discussed with the patient and family. After consideration of risks, benefits and other options for treatment, the patient has consented to  Procedure(s): TRANSESOPHAGEAL ECHOCARDIOGRAM (TEE) (N/A) as a surgical intervention .  The patient's history has been reviewed, patient examined, no change in status, stable for surgery.  I have reviewed the patient's chart and labs.  Questions were answered to the patient's satisfaction.     Dalton [employer]

## 2013-07-05 NOTE — Progress Notes (Addendum)
[facility] Progress Note    Carol [name] ZOX:[id number] DOB: 1980-07-19 DOA: 06/30/2013 PCP: No PCP Per Patient  Admit HPI / Brief Narrative: 32 y.o. female (transgender female to female) with a history of neurogenic bladder and lower extremity weakness. He lived with a chronic indwelling Foley catheter for many years but earlier this year underwent a procedure to construct an ileal urostomy. Apparently the procedure did not work and the urostomy had to be reversed. He had a suprapubic catheter placed at that time in July. He had a VAC wound dressing for several weeks. Since that time his chronic nausea and vomiting and constipation worsened. This required placement of a Hickman catheter for IV fluids. Since July he has had 3 episodes of fever that has led to hospitalization in [place] at [facility]. He says that his episodes have been described as SIRS. He states that his cultures have always been negative. He would be placed on antibiotics and improve within 48 hours. Blood cultures returned negative the antibiotics have generally been stopped and he would be discharged home. He has had negative abdominal scans the did not reveal any evidence of intra-abdominal infection. He states that he has lost about 50 pounds unintentionally since his last surgery in July. He is visiting family here for the holidays and developed a fever up to 104 yesterday leading to admission here.  HPI/Subjective: Pt going for TEE today, denies any new complaints.  Assessment/Plan:  Sepsis (HR 104, Temp 100.9 + UTI + positive blood cx), urine with Citrobacter and blood culture with staph aureus and GNR -Blood cultures with oxacillin sensitive staph aureus, and also now with GNR -appreciate the assistance follow for further recommendations -continue imipenem(started 12/29) and vanc  -s/p Hickman cath removal and PICC placement today per IR -Await TEE, echo with no reports of vegetations   Gram  negative rod UTI Blood culture now also with GNR 12/29, so this is likely not chronic colonization or contamination>> imipemen 12/29started per ID as above   Staph aureus in blood cx Worrisome for catheter related smoldering infection, or even SBE - ID following - remains on Vanc - oxacillin sens  Relapsing fevers Patient has had 3 episodes of fevers between July 2014 & now with unknown etiology - as per discussion above   Neurogenic bladder Has suprapubic cath s/p failed illeal conduit -Per urology/Manny-patient to followup with his urologist for suprapubic  catheter change.  Asthma Well compensated  Bipolar   Chronic pain syndrome Pain currently well controlled   Chronic nausea  Ulcerative colitis  Has been quiescent for years per his report  Constipation -Continue current bowel regimen, as above patient does not want any enema time. Obesity - Body mass index is 38.36 kg/(m^2).  Code Status: FULL Family Communication: no family at bedside Disposition Plan: to home when medically stable  Consultants: ID  Procedures: S/p Hickman removal 12/30 PICC placement 12/30 Study Conclusions  - Left ventricle: The cavity size was normal. Systolic function was normal. The estimated ejection fraction was in the range of 60% to 65%. Wall motion was normal; there were no regional wall motion abnormalities. - Atrial septum: The septum bowed from left to right, consistent with increased left atrial pressure. There was an atrial septal aneurysm.  Antibiotics: Aztreonam 12/26 >>12/28 Levofloxacin 12/26>>12/26 Vancomycin 12/26 >> Imipenem 12/29>>  DVT prophylaxis: SCDs only - reported "anayphylaxis" to lovenox   Objective: Blood pressure 120/66, pulse 88, temperature 99.6 F (37.6 C), temperature source Oral, resp. rate 6,  height 5\' 5"  (1.651 m), weight 104.554 kg (230 lb 8 oz), SpO2 98.00%.  Intake/Output Summary (Last 24 hours) at 07/05/13 1403 Last data filed at 07/05/13  0700  Gross per 24 hour  Intake   2300 ml  Output      0 ml  Net   2300 ml   Exam: General: No acute respiratory distress Chest:  PICC present LUE Lungs: Clear to auscultation bilaterally without wheezes or crackles Cardiovascular: Regular rate and rhythm without murmur gallop or rub normal S1 and S2 Abdomen: Nontender, nondistended, soft, bowel sounds positive, no rebound, no ascites, no appreciable mass Extremities: No significant cyanosis, clubbing, or edema bilateral lower extremities  Data Reviewed: Basic Metabolic Panel:  Recent Labs Lab 06/30/13 1020 06/30/13 1634 07/01/13 0425 07/04/13 0500  NA 136  --  136 141  K 4.1  --  3.7 3.8  CL 101  --  98 104  CO2 25  --  29 28  GLUCOSE 105*  --  126* 77  BUN 7  --  6 5*  CREATININE 1.00  --  0.94 0.78  CALCIUM 8.9  --  8.0* 8.5  MG  --  1.6  --   --    Liver Function Tests:  Recent Labs Lab 06/30/13 1020 07/01/13 0425  AST 14 14  ALT 12 9  ALKPHOS 61 49  BILITOT 0.6 0.4  PROT 6.5 5.3*  ALBUMIN 3.9 2.9*   CBC:  Recent Labs Lab 06/30/13 1020 07/01/13 0425 07/02/13 0500  WBC 7.9 5.5 5.9  NEUTROABS 6.3  --   --   HGB 13.2 11.7* 11.6*  HCT 39.0 35.2* 35.5*  MCV 85.5 87.3 87.7  PLT 199 165 165    Recent Results (from the past 240 hour(s))  CULTURE, BLOOD (ROUTINE X 2)     Status: None   Collection Time    06/30/13 10:20 AM      Result Value Range Status   Specimen Description BLOOD RT. ARM   Final   Special Requests Normal BOTTLES DRAWN AEROBIC AND ANAEROBIC  10CC   Final   Culture  Setup Time     Final   Value: 06/30/2013 14:26     Performed at [facility]   Culture     Final   Value: STAPHYLOCOCCUS AUREUS     Note: Gram Stain Report Called to,Read Back By and Verified With: [facility] STOWE 07/01/13 AT 0515 RIDK RIFAMPIN AND GENTAMICIN SHOULD NOT BE USED AS SINGLE DRUGS FOR TREATMENT OF STAPH INFECTIONS. This organism DOES NOT demonstrate inducible Clindamycin      resistance in vitro.      GRAM NEGATIVE RODS     Note: Gram Stain Report Called to,Read Back By and Verified With: MARGARET THOMPSON 07/03/13 1155 BY SMITHERSJ     Performed at [facility]   Report Status PENDING   Incomplete   Organism ID, Bacteria STAPHYLOCOCCUS AUREUS   Final  CULTURE, BLOOD (SINGLE)     Status: None   Collection Time    06/30/13 10:40 AM      Result Value Range Status   Specimen Description BLOOD LFT CHEST   Final   Special Requests Normal BOTTLES DRAWN AEROBIC AND ANAEROBIC 5CC   Final   Culture  Setup Time     Final   Value: 06/30/2013 14:26     Performed at [facility]   Culture     Final   Value:  BLOOD CULTURE RECEIVED NO GROWTH TO DATE CULTURE WILL BE HELD FOR 5 DAYS BEFORE ISSUING A FINAL NEGATIVE REPORT     Performed at [facility]   Report Status PENDING   Incomplete  CULTURE, BLOOD (ROUTINE X 2)     Status: None   Collection Time    06/30/13 10:55 AM      Result Value Range Status   Specimen Description BLOOD LEFT AC   Final   Special Requests Normal BOTTLES DRAWN AEROBIC AND ANAEROBIC 5CC   Final   Culture  Setup Time     Final   Value: 06/30/2013 14:26     Performed at [facility]   Culture     Final   Value: STAPHYLOCOCCUS AUREUS     Note: SUSCEPTIBILITIES PERFORMED ON PREVIOUS CULTURE WITHIN THE LAST 5 DAYS.     Note: Gram Stain Report Called to,Read Back By and Verified With: LAUREN HOUT 07/01/13 @ 12:18PM BY RUSCOE A.     Performed at [facility]   Report Status 07/03/2013 FINAL   Final  URINE CULTURE     Status: None   Collection Time    06/30/13 11:15 AM      Result Value Range Status   Specimen Description URINE, SUPRAPUBIC   Final   Special Requests Normal   Final   Culture  Setup Time     Final   Value: 06/30/2013 14:27     Performed at [facility] Count     Final   Value: >=100,000 COLONIES/ML     Performed at [facility]   Culture     Final   Value: [place]      Performed at [facility]   Report Status 07/02/2013 FINAL   Final   Organism ID, Bacteria CITROBACTER [email or web address]   Final  MRSA PCR SCREENING     Status: None   Collection Time    06/30/13  3:58 PM      Result Value Range Status   MRSA by PCR NEGATIVE  NEGATIVE Final   Comment:            The GeneXpert MRSA Assay (FDA     approved for NASAL specimens     only), is one component of a     comprehensive MRSA colonization     surveillance program. It is not     intended to diagnose MRSA     infection nor to guide or     monitor treatment for     MRSA infections.  CULTURE, BLOOD (ROUTINE X 2)     Status: None   Collection Time    07/02/13  2:35 PM      Result Value Range Status   Specimen Description BLOOD LEFT ARM   Final   Special Requests BOTTLES DRAWN AEROBIC AND ANAEROBIC 5CC   Final   Culture  Setup Time     Final   Value: 07/02/2013 22:55     Performed at [facility]   Culture     Final   Value:        BLOOD CULTURE RECEIVED NO GROWTH TO DATE CULTURE WILL BE HELD FOR 5 DAYS BEFORE ISSUING A FINAL NEGATIVE REPORT     Performed at [facility]   Report Status PENDING   Incomplete  CULTURE, BLOOD (ROUTINE X 2)     Status: None   Collection Time    07/02/13  2:40  PM      Result Value Range Status   Specimen Description BLOOD RIGHT ARM   Final   Special Requests BOTTLES DRAWN AEROBIC AND ANAEROBIC 5CC   Final   Culture  Setup Time     Final   Value: 07/02/2013 22:55     Performed at [facility]   Culture     Final   Value:        BLOOD CULTURE RECEIVED NO GROWTH TO DATE CULTURE WILL BE HELD FOR 5 DAYS BEFORE ISSUING A FINAL NEGATIVE REPORT     Performed at [facility]   Report Status PENDING   Incomplete     Studies:  I have Reviewed all Recent x-ray studies in detail   Scheduled Meds:  Scheduled Meds: . buPROPion  300 mg Oral Daily  . docusate sodium  100 mg Oral BID  . dronabinol  10 mg Oral TID AC & HS  . feeding  supplement (RESOURCE BREEZE)  1 Container Oral BID BM  . fentaNYL  75 mcg Transdermal Q72H  . imipenem-cilastatin  500 mg Intravenous Q6H  . lithium carbonate  900 mg Oral QHS  . multivitamin with minerals  1 tablet Oral Daily  . oxybutynin  5 mg Oral BID  . oxyCODONE  15 mg Oral 5 X Daily  . polyethylene glycol  17 g Oral Daily  . [START ON 07/08/2013] testosterone enanthate  140 mg Intramuscular Q14 Days  . traZODone  100 mg Oral QHS  . vancomycin  1,250 mg Intravenous Q8H    Time spent on care of this patient: 25 mins   Kela Millin [phone] [facility] Office  [phone] Pager - Text Page per Loretha Stapler as per below:  On-Call/Text Page:      [email or web address]      password [email or web address]  If 7PM-7AM, please contact night-coverage [email or web address] Password [email or web address] 07/05/2013, 2:03 PM   LOS: 5 days

## 2013-07-05 NOTE — Progress Notes (Signed)
[facility]  Date of Admission:  06/30/2013  Antibiotics: Antibiotics Given (last 72 hours)   Date/Time Action Medication Dose Rate   07/02/13 1816 Given   vancomycin (VANCOCIN) 1,250 mg in sodium chloride 0.9 % 250 mL IVPB 1,250 mg 166.7 mL/hr   07/03/13 0243 Given   vancomycin (VANCOCIN) 1,250 mg in sodium chloride 0.9 % 250 mL IVPB 1,250 mg 166.7 mL/hr   07/03/13 1123 Given   vancomycin (VANCOCIN) 1,250 mg in sodium chloride 0.9 % 250 mL IVPB 1,250 mg 166.7 mL/hr   07/03/13 1439 Given   imipenem-cilastatin (PRIMAXIN) 500 mg in sodium chloride 0.9 % 100 mL IVPB 500 mg 200 mL/hr   07/03/13 1853 Given   vancomycin (VANCOCIN) 1,250 mg in sodium chloride 0.9 % 250 mL IVPB 1,250 mg 166.7 mL/hr   07/03/13 2107 Given  [Multiple IV meds]   imipenem-cilastatin (PRIMAXIN) 500 mg in sodium chloride 0.9 % 100 mL IVPB 500 mg 200 mL/hr   07/04/13 0227 Given   imipenem-cilastatin (PRIMAXIN) 500 mg in sodium chloride 0.9 % 100 mL IVPB 500 mg 200 mL/hr   07/04/13 0318 Given   vancomycin (VANCOCIN) 1,250 mg in sodium chloride 0.9 % 250 mL IVPB 1,250 mg 166.7 mL/hr   07/04/13 0943 Given   imipenem-cilastatin (PRIMAXIN) 500 mg in sodium chloride 0.9 % 100 mL IVPB 500 mg 200 mL/hr   07/04/13 1106 Given   vancomycin (VANCOCIN) 1,250 mg in sodium chloride 0.9 % 250 mL IVPB 1,250 mg 166.7 mL/hr   07/04/13 1748 Given   imipenem-cilastatin (PRIMAXIN) 500 mg in sodium chloride 0.9 % 100 mL IVPB 500 mg 200 mL/hr   07/04/13 2012 Given   vancomycin (VANCOCIN) 1,250 mg in sodium chloride 0.9 % 250 mL IVPB 1,250 mg 166.7 mL/hr   07/04/13 2358 Given   imipenem-cilastatin (PRIMAXIN) 500 mg in sodium chloride 0.9 % 100 mL IVPB 500 mg 200 mL/hr   07/05/13 0238 Given   vancomycin (VANCOCIN) 1,250 mg in sodium chloride 0.9 % 250 mL IVPB 1,250 mg 166.7 mL/hr   07/05/13 1058 Given   vancomycin (VANCOCIN) 1,250 mg in sodium chloride 0.9 % 250 mL IVPB 1,250 mg 166.7 mL/hr       Subjective: Currently in endoscopy post TEE  Objective: Temp:  [98.6 F (37 C)-100 F (37.8 C)] 99.6 F (37.6 C) (12/31 1321) Pulse Rate:  [72-102] 86 (12/31 1415) Resp:  [6-25] 12 (12/31 1415) BP: (104-139)/(52-95) 104/56 mmHg (12/31 1415) SpO2:  [97 %-100 %] 97 % (12/31 1415) Weight:  [230 lb 8 oz (104.554 kg)] 230 lb 8 oz (104.554 kg) (12/31 0602)  Not avaialable  Lab Results Lab Results  Component Value Date   WBC 5.9 07/02/2013   HGB 11.6* 07/02/2013   HCT 35.5* 07/02/2013   MCV 87.7 07/02/2013   PLT 165 07/02/2013    Lab Results  Component Value Date   CREATININE 0.78 07/04/2013   BUN 5* 07/04/2013   NA 141 07/04/2013   K 3.8 07/04/2013   CL 104 07/04/2013   CO2 28 07/04/2013    Lab Results  Component Value Date   ALT 9 07/01/2013   AST 14 07/01/2013   ALKPHOS 49 07/01/2013   BILITOT 0.4 07/01/2013      Microbiology: Recent Results (from the past 240 hour(s))  CULTURE, BLOOD (ROUTINE X 2)     Status: None   Collection Time    06/30/13 10:20 AM      Result Value Range Status  Specimen Description BLOOD RT. ARM   Final   Special Requests Normal BOTTLES DRAWN AEROBIC AND ANAEROBIC  10CC   Final   Culture  Setup Time     Final   Value: 06/30/2013 14:26     Performed at [facility]   Culture     Final   Value: STAPHYLOCOCCUS AUREUS     Note: Gram Stain Report Called to,Read Back By and Verified With: [facility] STOWE 07/01/13 AT 0515 RIDK RIFAMPIN AND GENTAMICIN SHOULD NOT BE USED AS SINGLE DRUGS FOR TREATMENT OF STAPH INFECTIONS. This organism DOES NOT demonstrate inducible Clindamycin      resistance in vitro.     GRAM NEGATIVE RODS     Note: Gram Stain Report Called to,Read Back By and Verified With: MARGARET THOMPSON 07/03/13 1155 BY SMITHERSJ     Performed at [facility]   Report Status PENDING   Incomplete   Organism ID, Bacteria STAPHYLOCOCCUS AUREUS   Final  CULTURE, BLOOD (SINGLE)     Status: None   Collection Time     06/30/13 10:40 AM      Result Value Range Status   Specimen Description BLOOD LFT CHEST   Final   Special Requests Normal BOTTLES DRAWN AEROBIC AND ANAEROBIC 5CC   Final   Culture  Setup Time     Final   Value: 06/30/2013 14:26     Performed at [facility]   Culture     Final   Value:        BLOOD CULTURE RECEIVED NO GROWTH TO DATE CULTURE WILL BE HELD FOR 5 DAYS BEFORE ISSUING A FINAL NEGATIVE REPORT     Performed at [facility]   Report Status PENDING   Incomplete  CULTURE, BLOOD (ROUTINE X 2)     Status: None   Collection Time    06/30/13 10:55 AM      Result Value Range Status   Specimen Description BLOOD LEFT AC   Final   Special Requests Normal BOTTLES DRAWN AEROBIC AND ANAEROBIC 5CC   Final   Culture  Setup Time     Final   Value: 06/30/2013 14:26     Performed at [facility]   Culture     Final   Value: STAPHYLOCOCCUS AUREUS     Note: SUSCEPTIBILITIES PERFORMED ON PREVIOUS CULTURE WITHIN THE LAST 5 DAYS.     Note: Gram Stain Report Called to,Read Back By and Verified With: LAUREN HOUT 07/01/13 @ 12:18PM BY RUSCOE A.     Performed at [facility]   Report Status 07/03/2013 FINAL   Final  URINE CULTURE     Status: None   Collection Time    06/30/13 11:15 AM      Result Value Range Status   Specimen Description URINE, SUPRAPUBIC   Final   Special Requests Normal   Final   Culture  Setup Time     Final   Value: 06/30/2013 14:27     Performed at [facility] Count     Final   Value: >=100,000 COLONIES/ML     Performed at [facility]   Culture     Final   Value: [place]     Performed at [facility]   Report Status 07/02/2013 FINAL   Final   Organism ID, Bacteria CITROBACTER BRAAKII   Final  MRSA PCR SCREENING     Status: None   Collection Time  06/30/13  3:58 PM      Result Value Range Status   MRSA by PCR NEGATIVE  NEGATIVE Final   Comment:            The GeneXpert MRSA Assay (FDA      approved for NASAL specimens     only), is one component of a     comprehensive MRSA colonization     surveillance program. It is not     intended to diagnose MRSA     infection nor to guide or     monitor treatment for     MRSA infections.  CULTURE, BLOOD (ROUTINE X 2)     Status: None   Collection Time    07/02/13  2:35 PM      Result Value Range Status   Specimen Description BLOOD LEFT ARM   Final   Special Requests BOTTLES DRAWN AEROBIC AND ANAEROBIC 5CC   Final   Culture  Setup Time     Final   Value: 07/02/2013 22:55     Performed at [facility]   Culture     Final   Value:        BLOOD CULTURE RECEIVED NO GROWTH TO DATE CULTURE WILL BE HELD FOR 5 DAYS BEFORE ISSUING A FINAL NEGATIVE REPORT     Performed at [facility]   Report Status PENDING   Incomplete  CULTURE, BLOOD (ROUTINE X 2)     Status: None   Collection Time    07/02/13  2:40 PM      Result Value Range Status   Specimen Description BLOOD RIGHT ARM   Final   Special Requests BOTTLES DRAWN AEROBIC AND ANAEROBIC 5CC   Final   Culture  Setup Time     Final   Value: 07/02/2013 22:55     Performed at [facility]   Culture     Final   Value:        BLOOD CULTURE RECEIVED NO GROWTH TO DATE CULTURE WILL BE HELD FOR 5 DAYS BEFORE ISSUING A FINAL NEGATIVE REPORT     Performed at [facility]   Report Status PENDING   Incomplete    Studies/Results: US Renal  07/04/2013   CLINICAL DATA:  Suprapubic catheter infection  EXAM: RENAL/URINARY TRACT ULTRASOUND COMPLETE  COMPARISON:  None.  FINDINGS: Right Kidney:  Length: 9.3 cm. Echogenicity within normal limits. No mass or hydronephrosis visualized.  Left Kidney:  Length: 9.1 cm. Echogenicity within normal limits. No mass or hydronephrosis visualized.  Bladder:  The bladder is decompressed limiting evaluation. There is a suprapubic catheter present in the expected location of the bladder.  IMPRESSION: Normal renal ultrasound.    Electronically Signed   By: Elige Ko   On: 07/04/2013 19:09   Ir Fluoro Guide Cv Line Left  07/04/2013   CLINICAL DATA:  32 year old with bacteremia. Patient has a tunneled central venous catheter in the chest that needs to be removed. Request for a PICC line for antibiotic therapy.  EXAM: REMOVAL TUNNELED CENTRAL VENOUS CATHETER; PLACEMENT OF LEFT ARM PICC LINE WITH ULTRASOUND AND FLUOROSCOPIC GUIDANCE  Physician: Rachelle Hora. Henn, MD  MEDICATIONS: None  ANESTHESIA/SEDATION: Moderate sedation time: None  FLUOROSCOPY TIME:  18 seconds  PROCEDURE: The procedure was explained to the patient. The risks and benefits of the procedure were discussed and the patient's questions were addressed. Informed consent was obtained from the patient. The right chest catheter was prepped and draped in a sterile  fashion. Maximal barrier sterile technique was utilized including caps, mask, sterile gowns, sterile gloves, sterile drape, hand hygiene and skin antiseptic. The skin was anesthetized with 1% lidocaine. The catheter cuff was exposed and the entire catheter was removed without complication. Occlusive dressing was placed over the catheter exit site.  Attention was directed to the left arm. The patient has patent left brachial veins by ultrasound. Left upper arm was prepped and draped in sterile fashion. Maximal barrier sterile technique was utilized including caps, mask, sterile gowns, sterile gloves, sterile drape, hand hygiene and skin antiseptic. Skin was anesthetized with 1% lidocaine. A 21 gauge needle directed into a brachial vein with ultrasound guidance. A wire was advanced centrally and a peel-away sheath was placed. A dual lumen Power PICC line was cut to 36.5 cm and advanced through the peel-away sheath. Catheter tip placed in the lower SVC at the cavoatrial junction. Both lumens aspirated and flushed well. Catheter was flushed with normal saline. Catheter was sutured to the skin and a dressing was placed.  Fluoroscopic and ultrasound images were taken and saved for documentation.  COMPLICATIONS: None  FINDINGS: Successful removal of the tunneled central venous catheter. PICC line tip in the lower SVC.  IMPRESSION: Removal of the tunneled right chest catheter.  Successful placement of a left arm PICC line. PICC line tip in the lower SVC and ready to be used.   Electronically Signed   By: Richarda Overlie M.D.   On: 07/04/2013 16:23   Ir Removal Tun Cv Cath W/o Fl  07/04/2013   CLINICAL DATA:  32 year old with bacteremia. Patient has a tunneled central venous catheter in the chest that needs to be removed. Request for a PICC line for antibiotic therapy.  EXAM: REMOVAL TUNNELED CENTRAL VENOUS CATHETER; PLACEMENT OF LEFT ARM PICC LINE WITH ULTRASOUND AND FLUOROSCOPIC GUIDANCE  Physician: Rachelle Hora. Henn, MD  MEDICATIONS: None  ANESTHESIA/SEDATION: Moderate sedation time: None  FLUOROSCOPY TIME:  18 seconds  PROCEDURE: The procedure was explained to the patient. The risks and benefits of the procedure were discussed and the patient's questions were addressed. Informed consent was obtained from the patient. The right chest catheter was prepped and draped in a sterile fashion. Maximal barrier sterile technique was utilized including caps, mask, sterile gowns, sterile gloves, sterile drape, hand hygiene and skin antiseptic. The skin was anesthetized with 1% lidocaine. The catheter cuff was exposed and the entire catheter was removed without complication. Occlusive dressing was placed over the catheter exit site.  Attention was directed to the left arm. The patient has patent left brachial veins by ultrasound. Left upper arm was prepped and draped in sterile fashion. Maximal barrier sterile technique was utilized including caps, mask, sterile gowns, sterile gloves, sterile drape, hand hygiene and skin antiseptic. Skin was anesthetized with 1% lidocaine. A 21 gauge needle directed into a brachial vein with ultrasound guidance. A  wire was advanced centrally and a peel-away sheath was placed. A dual lumen Power PICC line was cut to 36.5 cm and advanced through the peel-away sheath. Catheter tip placed in the lower SVC at the cavoatrial junction. Both lumens aspirated and flushed well. Catheter was flushed with normal saline. Catheter was sutured to the skin and a dressing was placed. Fluoroscopic and ultrasound images were taken and saved for documentation.  COMPLICATIONS: None  FINDINGS: Successful removal of the tunneled central venous catheter. PICC line tip in the lower SVC.  IMPRESSION: Removal of the tunneled right chest catheter.  Successful placement of a left  arm PICC line. PICC line tip in the lower SVC and ready to be used.   Electronically Signed   By: Richarda Overlie M.D.   On: 07/04/2013 16:23   Ir US Guide Vasc Access Left  07/04/2013   CLINICAL DATA:  32 year old with bacteremia. Patient has a tunneled central venous catheter in the chest that needs to be removed. Request for a PICC line for antibiotic therapy.  EXAM: REMOVAL TUNNELED CENTRAL VENOUS CATHETER; PLACEMENT OF LEFT ARM PICC LINE WITH ULTRASOUND AND FLUOROSCOPIC GUIDANCE  Physician: Rachelle Hora. Henn, MD  MEDICATIONS: None  ANESTHESIA/SEDATION: Moderate sedation time: None  FLUOROSCOPY TIME:  18 seconds  PROCEDURE: The procedure was explained to the patient. The risks and benefits of the procedure were discussed and the patient's questions were addressed. Informed consent was obtained from the patient. The right chest catheter was prepped and draped in a sterile fashion. Maximal barrier sterile technique was utilized including caps, mask, sterile gowns, sterile gloves, sterile drape, hand hygiene and skin antiseptic. The skin was anesthetized with 1% lidocaine. The catheter cuff was exposed and the entire catheter was removed without complication. Occlusive dressing was placed over the catheter exit site.  Attention was directed to the left arm. The patient has patent  left brachial veins by ultrasound. Left upper arm was prepped and draped in sterile fashion. Maximal barrier sterile technique was utilized including caps, mask, sterile gowns, sterile gloves, sterile drape, hand hygiene and skin antiseptic. Skin was anesthetized with 1% lidocaine. A 21 gauge needle directed into a brachial vein with ultrasound guidance. A wire was advanced centrally and a peel-away sheath was placed. A dual lumen Power PICC line was cut to 36.5 cm and advanced through the peel-away sheath. Catheter tip placed in the lower SVC at the cavoatrial junction. Both lumens aspirated and flushed well. Catheter was flushed with normal saline. Catheter was sutured to the skin and a dressing was placed. Fluoroscopic and ultrasound images were taken and saved for documentation.  COMPLICATIONS: None  FINDINGS: Successful removal of the tunneled central venous catheter. PICC line tip in the lower SVC.  IMPRESSION: Removal of the tunneled right chest catheter.  Successful placement of a left arm PICC line. PICC line tip in the lower SVC and ready to be used.   Electronically Signed   By: Richarda Overlie M.D.   On: 07/04/2013 16:23    Assessment/Plan: 1) Staph aureus bacteremia line infection - Hickman out, picc in.  -TEE initial report is negative for vegetation. -he should get 2 weeks of IV vancomycin with trough goal 15-20 through January 10th and stop and pull picc. He will be in [place] at that time.  His primary doctor is aware (Dr. Elenor Legato, [phone]) and should follow up with him next week when he arrives.    2) GNR bacteremia - still not yet speicated.  On imipenem due to allergies, had citrobacter in urine that was cipro int. Complete imipenem today and stop at discharge.    OK from ID standpoint for discharge.  Please send record to his PCP.  Thanks  Staci Righter, MD [email or web address] [phone] pager   [phone]  cell 07/05/2013, 2:21 PM

## 2013-07-05 NOTE — Progress Notes (Signed)
ANTIBIOTIC CONSULT NOTE - Follow up  Pharmacy Consult:  Vancomycin, Primaxin Indication:  Sepsis  Allergies  Allergen Reactions  . Cephalosporins Anaphylaxis  . Lovenox [Enoxaparin Sodium] Anaphylaxis  . Sulfa Antibiotics Rash    Patient Measurements: Height: 5\' 5"  (165.1 cm) Weight: 230 lb 8 oz (104.554 kg) IBW/kg (Calculated) : 61.5  Vital Signs: Temp: 99.3 F (37.4 C) (12/31 0602) Temp src: Oral (12/31 0602) BP: 123/69 mmHg (12/31 0602) Pulse Rate: 95 (12/31 0602)  Labs:  Recent Labs  07/04/13 0500  CREATININE 0.78   Estimated Creatinine Clearance: 144.8 ml/min (by C-G formula based on Cr of 0.78). No results found for this basename: VANCOTROUGH, VANCOPEAK, VANCORANDOM, GENTTROUGH, GENTPEAK, GENTRANDOM, TOBRATROUGH, TOBRAPEAK, TOBRARND, AMIKACINPEAK, AMIKACINTROU, AMIKACIN,  in the last 72 hours    Assessment: 62 YOM with history of neurogenic bladder with suprapubic catheter and chronic vomiting syndrome with central line for hydration admitted with complaint of fever x 2 days. Has been on vanc/aztreonam. Blood cultures are now growing MSSA and GNR. Could be citrobacter since he did have that in his urine cultures. He does have a history of allergy to cephalosporins. Recent data shows that cross reactivity is lower.   Vanc 12/26 >> Azactam 12/26 >>12/28 LVQ 12/26 >> 12/26 Cefepime x 1 dose (anaphylaxis) Primaxin 12/29>>  12/26 blood cx >> MSSA, GNR 12/26 ucx >> citrobacter  Goal of Therapy:  Vancomycin trough level 15-20 mcg/ml  Plan:  Continue Vanc 1.25g IV q8h (stop date is 1/12/1/5) Continue Primaxin 500mg  IV q 6 hours Continue to follow  Thank you. Okey Regal, PharmD [phone]

## 2013-07-05 NOTE — Progress Notes (Signed)
  Echocardiogram Echocardiogram Transesophageal has been performed.  Wentworth Edelen 07/05/2013, 2:20 PM

## 2013-07-05 NOTE — Progress Notes (Signed)
OT Cancellation Note  Patient Details Name: Carol [name] MRN: [id number] DOB: [birth year]   Cancelled Treatment:    Reason Eval/Treat Not Completed:  (NPO, Preparing for procedure scheduled for ~1300 today).  Declined OT today secondary to TEE scheduled in few hours.   Giann Obara 07/05/2013, 11:03 AM

## 2013-07-05 NOTE — CV Procedure (Signed)
Procedure: TEE  Indication: Assess for endocarditis  Sedation: Fentanyl 100 mcg, Versed 10 mg IV, Benadryl 25 mg IV  Findings: Please see echo section in chart for full report.  Normal LV size, EF 60-65%.  Normal RV size and systolic function.  No valvular vegetations seen.  There did not appear to be a vegetation on his central line.  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   No endocarditis.   Marca Ancona 07/05/2013 2:14 PM

## 2013-07-06 ENCOUNTER — Encounter (HOSPITAL_COMMUNITY): Payer: Self-pay

## 2013-07-06 LAB — CBC
HCT: 35.5 % — ABNORMAL LOW (ref 39.0–52.0)
Hemoglobin: 12.2 g/dL — ABNORMAL LOW (ref 13.0–17.0)
MCH: 29.3 pg (ref 26.0–34.0)
MCHC: 34.4 g/dL (ref 30.0–36.0)
MCV: 85.3 fL (ref 78.0–100.0)
Platelets: 189 10*3/uL (ref 150–400)
RBC: 4.16 MIL/uL — ABNORMAL LOW (ref 4.22–5.81)
RDW: 13 % (ref 11.5–15.5)
WBC: 4.8 10*3/uL (ref 4.0–10.5)

## 2013-07-06 LAB — CULTURE, BLOOD (SINGLE)
Culture: NO GROWTH
Special Requests: NORMAL

## 2013-07-06 MED ORDER — PSEUDOEPHEDRINE HCL 30 MG PO TABS
30.0000 mg | ORAL_TABLET | Freq: Four times a day (QID) | ORAL | Status: DC | PRN
  Filled 2013-07-06: qty 1

## 2013-07-06 MED ORDER — DM-GUAIFENESIN ER 30-600 MG PO TB12
1.0000 | ORAL_TABLET | Freq: Two times a day (BID) | ORAL | Status: DC
  Administered 2013-07-06 – 2013-07-07 (×3): 1 via ORAL
  Filled 2013-07-06 (×4): qty 1

## 2013-07-06 MED ORDER — LORATADINE 10 MG PO TABS
10.0000 mg | ORAL_TABLET | Freq: Every day | ORAL | Status: DC
  Administered 2013-07-06 – 2013-07-07 (×2): 10 mg via ORAL
  Filled 2013-07-06 (×2): qty 1

## 2013-07-06 NOTE — Progress Notes (Signed)
Physical Therapy Treatment Patient Details Name: Carol MarvelSidney [name] MRN: [id number] DOB: January 21, 1981 Today's Date: 07/06/2013 Time: [id number] Carol [name] Time Calculation (min): 14 min  Carol [name] Assessment / Plan / Recommendation  History of Present Illness Carol [name] admit for SIRS.  History of RSD.     Carol [name] Comments   Carol [name] agreeable to participate in therapy but states he doesn't feel today.  He requested to ambulate only in room.  Carol [name] states he feels like he's moving at baseline.     Follow Up Recommendations  Home health Carol [name];Supervision for mobility/OOB     Does the patient have the potential to tolerate intense rehabilitation     Barriers to Discharge        Equipment Recommendations   (Carol [name] has loftstrand crutches)    Recommendations for Other Services    Frequency Min 3X/week   Progress towards Carol [name] Goals Progress towards Carol [name] goals: Progressing toward goals  Plan Current plan remains appropriate    Precautions / Restrictions Precautions Precautions: Fall Restrictions Weight Bearing Restrictions: No   Pertinent Vitals/Pain "I just don't feel well"      Mobility  Bed Mobility Bed Mobility: Supine to Sit;Sitting - Scoot to Edge of Bed Supine to Sit: 6: Modified independent (Device/Increase time);HOB flat Sitting - Scoot to Edge of Bed: 6: Modified independent (Device/Increase time) Transfers Transfers: Sit to Stand;Stand to Sit Sit to Stand: 6: Modified independent (Device/Increase time);With upper extremity assist;From bed Stand to Sit: 6: Modified independent (Device/Increase time);With upper extremity assist;With armrests;To chair/3-in-1 Details for Transfer Assistance: Carol [name] demonstrated safe use of loftstrand crutches to achieve standing Ambulation/Gait Ambulation/Gait Assistance: 5: Supervision Ambulation Distance (Feet): 30 Feet Assistive device: Lofstrands Gait Pattern: Step-through pattern Gait velocity: Decreased General Gait Details: Carol [name] requested to just ambulate in room due to not feeling  well.   Stairs: No Wheelchair Mobility Wheelchair Mobility: No      Carol [name] Goals (current goals can now be found in the care plan section) Acute Rehab Carol [name] Goals Patient Stated Goal: to go home Carol [name] Goal Formulation: With patient Time For Goal Achievement: 07/08/13 Potential to Achieve Goals: Good  Visit Information  Last Carol [name] Received On: 07/06/13 Assistance Needed: +1 History of Present Illness: Carol [name] admit for SIRS.  History of RSD.      Subjective Data  Patient Stated Goal: to go home   Cognition  Cognition Arousal/Alertness: Awake/alert Behavior During Therapy: WFL for tasks assessed/performed Overall Cognitive Status: Within Functional Limits for tasks assessed    Balance     End of Session Carol [name] - End of Session Activity Tolerance: Patient tolerated treatment well Patient left: in chair;with call bell/phone within reach Nurse Communication: Mobility status   Carol [name]     Carol [name], Carol [name] Carol [name] 07/06/2013, 2:04 PM   Carol [name], PTA [phone] 07/06/2013

## 2013-07-06 NOTE — Progress Notes (Signed)
[facility] Progress Note    Carol [name] ZOX:[id number] DOB: [birth year] DOA: 06/30/2013 PCP: No PCP Per Patient  Admit HPI / Brief Narrative: 33 y.o. female (transgender female to female) with a history of neurogenic bladder and lower extremity weakness. He lived with a chronic indwelling Foley catheter for many years but earlier this year underwent a procedure to construct an ileal urostomy. Apparently the procedure did not work and the urostomy had to be reversed. He had a suprapubic catheter placed at that time in July. He had a VAC wound dressing for several weeks. Since that time his chronic nausea and vomiting and constipation worsened. This required placement of a Hickman catheter for IV fluids. Since July he has had 3 episodes of fever that has led to hospitalization in [place] at [facility]. He says that his episodes have been described as SIRS. He states that his cultures have always been negative. He would be placed on antibiotics and improve within 48 hours. Blood cultures returned negative the antibiotics have generally been stopped and he would be discharged home. He has had negative abdominal scans the did not reveal any evidence of intra-abdominal infection. He states that he has lost about 50 pounds unintentionally since his last surgery in July. He is visiting family here for the holidays and developed a fever up to 104 yesterday leading to admission here.  HPI/Subjective: States not feeling well today-complaining of headache and sore throat-status post TEE 12/31  Assessment/Plan:  Sepsis (HR 104, Temp 100.9 + UTI + positive blood cx), urine with Citrobacter and blood culture with staph aureus and GNR -Blood cultures with oxacillin sensitive staph aureus, and also now with GNR -appreciate the assistance follow for further recommendations -continue imipenem(started 12/29) and vanc  -s/p Hickman cath removal and PICC placement today per IR -Await TEE, echo  with no reports of vegetations  -Discussed fever with Dr.,Comer and he recommends to continue current antibiotics and plan is for 2 weeks of vancomycin upon discharge, independent to be discontinued upon discharge -Will treat patient's URI symptoms symptomatically at this time and discharge when he improves clinically Gram negative rod UTI Blood culture now also with GNR 12/29, so this is likely not chronic colonization or contamination>> imipemen 12/29started per ID as above   Staph aureus in blood cx Worrisome for catheter related smoldering infection, or even SBE - ID following - remains on Vanc - oxacillin sens  Relapsing fevers Patient has had 3 episodes of fevers between July 2014 & now with unknown etiology - as per discussion above   Neurogenic bladder Has suprapubic cath s/p failed illeal conduit -Per urology/Manny-patient to followup with his urologist for suprapubic  catheter change.  Asthma Well compensated  Bipolar   Chronic pain syndrome Pain currently well controlled   Chronic nausea  Ulcerative colitis  Has been quiescent for years per his report  Constipation -Continue current bowel regimen, as above patient does not want any enema time. URI symptoms -Will add Claritin/Sudafed and Mucinex and follow. Obesity - Body mass index is 37.74 kg/(m^2).  Code Status: FULL Family Communication: no family at bedside Disposition Plan: to home when medically stable  Consultants: ID  Procedures: S/p Hickman removal 12/30 PICC placement 12/30 Study Conclusions  - Left ventricle: The cavity size was normal. Systolic function was normal. The estimated ejection fraction was in the range of 60% to 65%. Wall motion was normal; there were no regional wall motion abnormalities. - Atrial septum: The septum bowed from left to  right, consistent with increased left atrial pressure. There was an atrial septal aneurysm.  Antibiotics: Aztreonam 12/26 >>12/28 Levofloxacin  12/26>>12/26 Vancomycin 12/26 >> Imipenem 12/29>>  DVT prophylaxis: SCDs only - reported "anayphylaxis" to lovenox   Objective: Blood pressure 117/65, pulse 95, temperature 100 F (37.8 C), temperature source Oral, resp. rate 18, height 5\' 5"  (1.651 m), weight 102.876 kg (226 lb 12.8 oz), SpO2 97.00%.  Intake/Output Summary (Last 24 hours) at 07/06/13 1815 Last data filed at 07/06/13 1800  Gross per 24 hour  Intake   3375 ml  Output   2400 ml  Net    975 ml   Exam: General: No acute respiratory distress Chest:  PICC present LUE Lungs: Clear to auscultation bilaterally without wheezes or crackles Cardiovascular: Regular rate and rhythm without murmur gallop or rub normal S1 and S2 Abdomen: Nontender, nondistended, soft, bowel sounds positive, no rebound, no ascites, no appreciable mass Extremities: No significant cyanosis, clubbing, or edema bilateral lower extremities  Data Reviewed: Basic Metabolic Panel:  Recent Labs Lab 06/30/13 1020 06/30/13 1634 07/01/13 0425 07/04/13 0500  NA 136  --  136 141  K 4.1  --  3.7 3.8  CL 101  --  98 104  CO2 25  --  29 28  GLUCOSE 105*  --  126* 77  BUN 7  --  6 5*  CREATININE 1.00  --  0.94 0.78  CALCIUM 8.9  --  8.0* 8.5  MG  --  1.6  --   --    Liver Function Tests:  Recent Labs Lab 06/30/13 1020 07/01/13 0425  AST 14 14  ALT 12 9  ALKPHOS 61 49  BILITOT 0.6 0.4  PROT 6.5 5.3*  ALBUMIN 3.9 2.9*   CBC:  Recent Labs Lab 06/30/13 1020 07/01/13 0425 07/02/13 0500 07/06/13 0505  WBC 7.9 5.5 5.9 4.8  NEUTROABS 6.3  --   --   --   HGB 13.2 11.7* 11.6* 12.2*  HCT 39.0 35.2* 35.5* 35.5*  MCV 85.5 87.3 87.7 85.3  PLT 199 165 165 189    Recent Results (from the past 240 hour(s))  CULTURE, BLOOD (ROUTINE X 2)     Status: None   Collection Time    06/30/13 10:20 AM      Result Value Range Status   Specimen Description BLOOD RT. ARM   Final   Special Requests Normal BOTTLES DRAWN AEROBIC AND ANAEROBIC  10CC    Final   Culture  Setup Time     Final   Value: 06/30/2013 14:26     Performed at [facility]   Culture     Final   Value: STAPHYLOCOCCUS AUREUS     Note: Gram Stain Report Called to,Read Back By and Verified With: [facility] STOWE 07/01/13 AT 0515 RIDK RIFAMPIN AND GENTAMICIN SHOULD NOT BE USED AS SINGLE DRUGS FOR TREATMENT OF STAPH INFECTIONS. This organism DOES NOT demonstrate inducible Clindamycin      resistance in vitro.     GRAM NEGATIVE RODS     Note: Gram Stain Report Called to,Read Back By and Verified With: MARGARET THOMPSON 07/03/13 1155 BY SMITHERSJ     Performed at [facility]   Report Status PENDING   Incomplete   Organism ID, Bacteria STAPHYLOCOCCUS AUREUS   Final  CULTURE, BLOOD (SINGLE)     Status: None   Collection Time    06/30/13 10:40 AM      Result Value Range Status   Specimen Description  BLOOD LFT CHEST   Final   Special Requests Normal BOTTLES DRAWN AEROBIC AND ANAEROBIC 5CC   Final   Culture  Setup Time     Final   Value: 06/30/2013 14:26     [facility]   Culture     Final   Value: NO GROWTH 5 DAYS     [facility]   Report Status 07/06/2013 FINAL   Final  CULTURE, BLOOD (ROUTINE X 2)     Status: None   Collection Time    06/30/13 10:55 AM      Result Value Range Status   Specimen Description BLOOD LEFT AC   Final   Special Requests Normal BOTTLES DRAWN AEROBIC AND ANAEROBIC 5CC   Final   Culture  Setup Time     Final   Value: 06/30/2013 14:26     [facility]   Culture     Final   Value: STAPHYLOCOCCUS AUREUS     Note: SUSCEPTIBILITIES PERFORMED ON PREVIOUS CULTURE WITHIN THE LAST 5 DAYS.     Note: Gram Stain Report Called to,Read Back By and Verified With: LAUREN HOUT 07/01/13 @ 12:18PM BY RUSCOE A.     [facility]   Report Status 07/03/2013 FINAL   Final  URINE CULTURE     Status: None   Collection Time    06/30/13 11:15 AM      Result Value  Range Status   Specimen Description URINE, SUPRAPUBIC   Final   Special Requests Normal   Final   Culture  Setup Time     Final   Value: 06/30/2013 14:27     [facility]   Colony Count     Final   Value: >=100,000 COLONIES/ML     [facility]   Culture     Final   Value: Daria PasturesITROBACTER BRAAKII     [facility]formed at [facility]   Report Status 07/02/2013 FINAL   Final   Organism ID, Bacteria CITROBACTER BRAAKII   Final  MRSA PCR SCREENING     Status: None   Collection Time    06/30/13  3:58 PM      Result Value Range Status   MRSA by PCR NEGATIVE  NEGATIVE Final   Comment:            The GeneXpert MRSA Assay (FDA     approved for NASAL specimens     only), is one component of a     comprehensive MRSA colonization     surveillance program. It is not     intended to diagnose MRSA     infection nor to guide or     monitor treatment for     MRSA infections.  CULTURE, BLOOD (ROUTINE X 2)     Status: None   Collection Time    07/02/13  2:35 PM      Result Value Range Status   Specimen Description BLOOD LEFT ARM   Final   Special Requests BOTTLES DRAWN AEROBIC AND ANAEROBIC 5CC   Final   Culture  Setup Time     Final   Value: 07/02/2013 22:55     [facility]   Culture     Final   Value:        BLOOD CULTURE RECEIVED NO GROWTH TO DATE CULTURE WILL BE HELD FOR 5 DAYS BEFORE ISSUING A FINAL  NEGATIVE REPORT     Performed at [facility]   Report Status PENDING   Incomplete  CULTURE, BLOOD (ROUTINE X 2)     Status: None   Collection Time    07/02/13  2:40 PM      Result Value Range Status   Specimen Description BLOOD RIGHT ARM   Final   Special Requests BOTTLES DRAWN AEROBIC AND ANAEROBIC 5CC   Final   Culture  Setup Time     Final   Value: 07/02/2013 22:55     Performed at [facility]   Culture     Final   Value:        BLOOD CULTURE RECEIVED NO GROWTH TO DATE CULTURE WILL BE HELD FOR 5 DAYS  BEFORE ISSUING A FINAL NEGATIVE REPORT     Performed at [facility]   Report Status PENDING   Incomplete     Studies:  I have Reviewed all Recent x-ray studies in detail   Scheduled Meds:  Scheduled Meds: . buPROPion  300 mg Oral Daily  . dextromethorphan-guaiFENesin  1 tablet Oral BID  . docusate sodium  100 mg Oral BID  . dronabinol  10 mg Oral TID AC & HS  . feeding supplement (RESOURCE BREEZE)  1 Container Oral BID BM  . fentaNYL  75 mcg Transdermal Q72H  . imipenem-cilastatin  500 mg Intravenous Q6H  . lithium carbonate  900 mg Oral QHS  . loratadine  10 mg Oral Daily  . multivitamin with minerals  1 tablet Oral Daily  . oxybutynin  5 mg Oral BID  . oxyCODONE  15 mg Oral 5 X Daily  . polyethylene glycol  17 g Oral Daily  . [START ON 07/08/2013] testosterone enanthate  140 mg Intramuscular Q14 Days  . traZODone  100 mg Oral QHS  . vancomycin  1,250 mg Intravenous Q8H    Time spent on care of this patient: 25 mins   Kela Millin [phone] [facility] Office  [phone] Pager - Text Page per Loretha Stapler as per below:  On-Call/Text Page:      [email or web address]      password [email or web address]  If 7PM-7AM, please contact night-coverage [email or web address] Password [email or web address] 07/06/2013, 6:15 PM   LOS: 6 days

## 2013-07-07 ENCOUNTER — Encounter (HOSPITAL_COMMUNITY): Payer: Self-pay | Admitting: Cardiology

## 2013-07-07 MED ORDER — FENTANYL 12 MCG/HR TD PT72: 12.5000 ug | MEDICATED_PATCH | TRANSDERMAL | Status: AC

## 2013-07-07 MED ORDER — HEPARIN SOD (PORK) LOCK FLUSH 100 UNIT/ML IV SOLN
250.0000 [IU] | INTRAVENOUS | Status: AC | PRN
  Administered 2013-07-07: 500 [IU]

## 2013-07-07 MED ORDER — DM-GUAIFENESIN ER 30-600 MG PO TB12: 1.0000 | ORAL_TABLET | Freq: Two times a day (BID) | ORAL | Status: AC

## 2013-07-07 MED ORDER — PROMETHAZINE HCL 25 MG/ML IJ SOLN: 25.0000 mg | Freq: Four times a day (QID) | INTRAMUSCULAR | Status: AC | PRN

## 2013-07-07 MED ORDER — OXYCODONE HCL 15 MG PO TABS: 15.0000 mg | ORAL_TABLET | Freq: Every day | ORAL | Status: AC

## 2013-07-07 MED ORDER — VANCOMYCIN HCL 10 G IV SOLR: 1250.0000 mg | Freq: Three times a day (TID) | INTRAVENOUS | Status: DC

## 2013-07-07 MED ORDER — VANCOMYCIN HCL 10 G IV SOLR: 1250.0000 mg | Freq: Three times a day (TID) | INTRAVENOUS | Status: AC

## 2013-07-07 MED ORDER — HEPARIN SOD (PORK) LOCK FLUSH 100 UNIT/ML IV SOLN
250.0000 [IU] | INTRAVENOUS | Status: AC | PRN
  Administered 2013-07-07: 250 [IU]

## 2013-07-07 MED ORDER — LORATADINE 10 MG PO TABS: 10.0000 mg | ORAL_TABLET | Freq: Every day | ORAL | Status: AC

## 2013-07-07 MED ORDER — FENTANYL 25 MCG/HR TD PT72: 50.0000 ug | MEDICATED_PATCH | TRANSDERMAL | Status: AC

## 2013-07-07 NOTE — Discharge Summary (Signed)
Physician Discharge Summary  Carol [name] ZOX:[id number] DOB: 03-Nov-1980 DOA: 06/30/2013  PCP: No PCP Per Patient  Admit date: 06/30/2013 Discharge date: 07/07/2013  Time spent: >30 minutes  Recommendations for Outpatient Follow-up:  Follow-up Information   Please follow up. (PCP/Dr Elenor Legato next week on return to [place], call for appt)       Please follow up. (urologist, next week or return to [place], call for appointment)        Discharge Diagnoses:  Principal Problem:   SIRS (systemic inflammatory response syndrome) Active Problems:   Fever   UTI (urinary tract infection)   Chronic pain syndrome   Asthma, chronic   Neurogenic bladder   Bipolar disorder, unspecified   Ulcerative colitis, unspecified   History of nausea and vomiting   Chronic constipation   Reflex sympathetic dystrophy   Discharge Condition: improved/stable  Diet recommendation: regular  Filed Weights   07/04/13 0616 07/05/13 0602 07/06/13 0500  Weight: 104.826 kg (231 lb 1.6 oz) 104.554 kg (230 lb 8 oz) 102.876 kg (226 lb 12.8 oz)    History of present illness:  33 y.o. female (transgender female to female) with a history of neurogenic bladder and lower extremity weakness. He lived with a chronic indwelling Foley catheter for many years but earlier this year underwent a procedure to construct an ileal urostomy. Apparently the procedure did not work and the urostomy had to be reversed. He had a suprapubic catheter placed at that time in July. He had a VAC wound dressing for several weeks. Since that time his chronic nausea and vomiting and constipation worsened. This required placement of a Hickman catheter for IV fluids. Since July he has had 3 episodes of fever that has led to hospitalization in [place] at [facility]. He says that his episodes have been described as SIRS. He states that his cultures have always been negative. He would be placed on antibiotics and improve within  48 hours. Blood cultures returned negative the antibiotics have generally been stopped and he would be discharged home. He has had negative abdominal scans the did not reveal any evidence of intra-abdominal infection. He states that he has lost about 50 pounds unintentionally since his last surgery in July. He is visiting family here for the holidays and developed a fever up to 104. He was admitted for further evaluation and management.    Hospital Course:  Sepsis (HR 104, Temp 100.9 + UTI + positive blood cx), urine with Citrobacter and blood culture with staph aureus and GNR  As Discussed above upon admission blood and urine cultures were done and patient was started on empiric broad-spectrum antibiotics -Blood cultures grew oxacillin sensitive staph aureus, and as well as GNR -Infectious disease was assaulted and followed and managed his antibiotics -Blood cultures were repeated and came back showing no growth -He had a Hickman catheter and this was discontinued/removed on 12/30 and PICC line was placed.. -echo done showed no reports of vegetations, TEE showed no vegetations as well please see full report below  -pt fever100.4 on 07/06/13, discussed with Dr.,Comer and he recommends to continue current antibiotics and plan is for 2 weeks-through January 10 vancomycin upon discharge, imipenem to be discontinued today upon discharge  -PICC line is to be removed after IV antibiotic completion on January 10. -Case manager has arranged for antibiotics to be administered while he is here per advanced home health care; and also once he returns to [place] next week for home health care there to continue antibiotics  through January 10. -He is to follow up,with his PCP Dr Elenor Legato next week on return. Gram negative rod UTI  Blood culture now also with GNR 12/29, so this is likely not chronic colonization or contamination>> imipemen 12/29started per ID as above and will be discontinued today upon  discharge as per ID recommendations.  Staph aureus in blood cx  As discussed above, he will be discharged on Vanc as discussed above. Relapsing fevers  Patient has had 3 episodes of fevers between July 2014 & now with unknown etiology - as per discussion above  Neurogenic bladder  Has suprapubic cath s/p failed illeal conduit  -Per urology/Manny-patient to followup with his urologist for suprapubic catheter change in [place].  Asthma  Well compensated  Bipolar  Chronic pain syndrome  Pain currently well controlled  Chronic nausea  Ulcerative colitis  Has been quiescent for years per his report  Constipation  -Continue bowel regimen on discharge URI symptoms  -was treated with Claritin/Sudafed and Mucinex, follow up outpt.  Consultants:  ID  Cardiology for TEE Urology Procedures:  S/p Hickman removal 12/30  PICC placement 12/30  Study Conclusions  - Left ventricle: The cavity size was normal. Systolic function was normal. The estimated ejection fraction was in the range of 60% to 65%. Wall motion was normal; there were no regional wall motion abnormalities. - Atrial septum: The septum bowed from left to right, consistent with increased left atrial pressure. There was an atrial septal aneurysm.  TEE on 12/31 Findings: Please see echo section in chart for full report. Normal LV size, EF 60-65%. Normal RV size and systolic function. No valvular vegetations seen. There did not appear to be a vegetation on his central line. 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  No endocarditis.    Discharge Exam: Filed Vitals:   07/07/13 1300  BP: 100/51  Pulse: 82  Temp: 98.8 F (37.1 C)  Resp: 17   Exam:  General: No acute respiratory distress  Chest: PICC present LUE  Lungs: Clear to auscultation bilaterally without wheezes or crackles  Cardiovascular: Regular rate and rhythm without murmur gallop or rub normal S1 and S2  Abdomen: Nontender,  nondistended, soft, bowel sounds positive, no rebound, no ascites, no appreciable mass  Extremities: No significant cyanosis, clubbing, or edema bilateral lower extremities    Discharge Instructions  Discharge Orders   Future Orders Complete By Expires   Diet general  As directed    Increase activity slowly  As directed        Medication List         buPROPion 150 MG 12 hr tablet  Commonly known as:  WELLBUTRIN SR  Take 300 mg by mouth daily.     dextromethorphan-guaiFENesin 30-600 MG per 12 hr tablet  Commonly known as:  MUCINEX DM  Take 1 tablet by mouth 2 (two) times daily.     docusate sodium 100 MG capsule  Commonly known as:  COLACE  Take 100 mg by mouth 2 (two) times daily.     dronabinol 10 MG capsule  Commonly known as:  MARINOL  Take 10 mg by mouth 4 (four) times daily.     fentaNYL 12 MCG/HR  Commonly known as:  DURAGESIC - dosed mcg/hr  Place 1 patch (12.5 mcg total) onto the skin every 3 (three) days.     fentaNYL 25 MCG/HR patch  Commonly known as:  DURAGESIC - dosed mcg/hr  Place 2 patches (  50 mcg total) onto the skin every 3 (three) days.     lithium carbonate 450 MG CR tablet  Commonly known as:  ESKALITH  Take 900 mg by mouth at bedtime.     loratadine 10 MG tablet  Commonly known as:  CLARITIN  Take 1 tablet (10 mg total) by mouth daily.     metoCLOPramide 10 MG tablet  Commonly known as:  REGLAN  Take 10 mg by mouth every 8 (eight) hours as needed for nausea.     ondansetron 8 MG disintegrating tablet  Commonly known as:  ZOFRAN-ODT  Take 8 mg by mouth 4 (four) times daily as needed for nausea or vomiting.     oxybutynin 5 MG tablet  Commonly known as:  DITROPAN  Take 5 mg by mouth 2 (two) times daily.     oxyCODONE 15 MG immediate release tablet  Commonly known as:  ROXICODONE  Take 1 tablet (15 mg total) by mouth 5 (five) times daily.     polyethylene glycol packet  Commonly known as:  MIRALAX / GLYCOLAX  Take 17 g by mouth  daily.     promethazine 25 MG/ML injection  Commonly known as:  PHENERGAN  Inject 1 mL (25 mg total) into the vein every 6 (six) hours as needed for nausea or vomiting.     sodium chloride 0.9 % SOLN 250 mL with vancomycin 10 G SOLR 1,250 mg  Inject 1,250 mg into the vein every 8 (eight) hours.     testosterone enanthate 200 MG/ML injection  Commonly known as:  DELATESTRYL  Inject 140 mg into the muscle every 14 (fourteen) days. For IM use only     traZODone 100 MG tablet  Commonly known as:  DESYREL  Take 100 mg by mouth at bedtime.     UNABLE TO FIND  1 each at bedtime. Med Name: 2 liters of normal saline with potassium.  Administered during bedtime daily for vomiting syndrome.       Allergies  Allergen Reactions  . Cephalosporins Anaphylaxis  . Lovenox [Enoxaparin Sodium] Anaphylaxis  . Sulfa Antibiotics Rash       Follow-up Information   Please follow up. (PCP/Dr Elenor LegatoLederman next week on return to [place], call for appt)       Please follow up. (urologist, next week or return to [place], call for appointment)        The results of significant diagnostics from this hospitalization (including imaging, microbiology, ancillary and laboratory) are listed below for reference.    Significant Diagnostic Studies: Dg Chest 2 View  07/02/2013   CLINICAL DATA:  PICC line placement  EXAM: CHEST  2 VIEW  COMPARISON:  06/30/2013  FINDINGS: Right IJ PICC line tip in the upper SVC at the azygos level. Normal heart size. Low lung volumes. No focal pneumonia, collapse or consolidation. No edema, pleural effusion or pneumothorax. Trachea midline. Thoracic stimulator noted.  IMPRESSION: Right PICC line tip upper SVC  No acute chest process   Electronically Signed   By: Ruel Favorsrevor  Shick M.D.   On: 07/02/2013 01:51   [place] Renal  07/04/2013   CLINICAL DATA:  Suprapubic catheter infection  EXAM: RENAL/URINARY TRACT ULTRASOUND COMPLETE  COMPARISON:  None.  FINDINGS: Right Kidney:   Length: 9.3 cm. Echogenicity within normal limits. No mass or hydronephrosis visualized.  Left Kidney:  Length: 9.1 cm. Echogenicity within normal limits. No mass or hydronephrosis visualized.  Bladder:  The bladder is decompressed limiting evaluation. There is a suprapubic catheter  present in the expected location of the bladder.  IMPRESSION: Normal renal ultrasound.   Electronically Signed   By: Elige Ko   On: 07/04/2013 19:09   Ir Fluoro Guide Cv Line Left  07/04/2013   CLINICAL DATA:  33 year old with bacteremia. Patient has a tunneled central venous catheter in the chest that needs to be removed. Request for a PICC line for antibiotic therapy.  EXAM: REMOVAL TUNNELED CENTRAL VENOUS CATHETER; PLACEMENT OF LEFT ARM PICC LINE WITH ULTRASOUND AND FLUOROSCOPIC GUIDANCE  Physician: Rachelle Hora. Henn, MD  MEDICATIONS: None  ANESTHESIA/SEDATION: Moderate sedation time: None  FLUOROSCOPY TIME:  18 seconds  PROCEDURE: The procedure was explained to the patient. The risks and benefits of the procedure were discussed and the patient's questions were addressed. Informed consent was obtained from the patient. The right chest catheter was prepped and draped in a sterile fashion. Maximal barrier sterile technique was utilized including caps, mask, sterile gowns, sterile gloves, sterile drape, hand hygiene and skin antiseptic. The skin was anesthetized with 1% lidocaine. The catheter cuff was exposed and the entire catheter was removed without complication. Occlusive dressing was placed over the catheter exit site.  Attention was directed to the left arm. The patient has patent left brachial veins by ultrasound. Left upper arm was prepped and draped in sterile fashion. Maximal barrier sterile technique was utilized including caps, mask, sterile gowns, sterile gloves, sterile drape, hand hygiene and skin antiseptic. Skin was anesthetized with 1% lidocaine. A 21 gauge needle directed into a brachial vein with ultrasound  guidance. A wire was advanced centrally and a peel-away sheath was placed. A dual lumen Power PICC line was cut to 36.5 cm and advanced through the peel-away sheath. Catheter tip placed in the lower SVC at the cavoatrial junction. Both lumens aspirated and flushed well. Catheter was flushed with normal saline. Catheter was sutured to the skin and a dressing was placed. Fluoroscopic and ultrasound images were taken and saved for documentation.  COMPLICATIONS: None  FINDINGS: Successful removal of the tunneled central venous catheter. PICC line tip in the lower SVC.  IMPRESSION: Removal of the tunneled right chest catheter.  Successful placement of a left arm PICC line. PICC line tip in the lower SVC and ready to be used.   Electronically Signed   By: Richarda Overlie M.D.   On: 07/04/2013 16:23   Ir Removal Tun Cv Cath W/o Fl  07/04/2013   CLINICAL DATA:  33 year old with bacteremia. Patient has a tunneled central venous catheter in the chest that needs to be removed. Request for a PICC line for antibiotic therapy.  EXAM: REMOVAL TUNNELED CENTRAL VENOUS CATHETER; PLACEMENT OF LEFT ARM PICC LINE WITH ULTRASOUND AND FLUOROSCOPIC GUIDANCE  Physician: Rachelle Hora. Henn, MD  MEDICATIONS: None  ANESTHESIA/SEDATION: Moderate sedation time: None  FLUOROSCOPY TIME:  18 seconds  PROCEDURE: The procedure was explained to the patient. The risks and benefits of the procedure were discussed and the patient's questions were addressed. Informed consent was obtained from the patient. The right chest catheter was prepped and draped in a sterile fashion. Maximal barrier sterile technique was utilized including caps, mask, sterile gowns, sterile gloves, sterile drape, hand hygiene and skin antiseptic. The skin was anesthetized with 1% lidocaine. The catheter cuff was exposed and the entire catheter was removed without complication. Occlusive dressing was placed over the catheter exit site.  Attention was directed to the left arm. The patient  has patent left brachial veins by ultrasound. Left upper arm was prepped  and draped in sterile fashion. Maximal barrier sterile technique was utilized including caps, mask, sterile gowns, sterile gloves, sterile drape, hand hygiene and skin antiseptic. Skin was anesthetized with 1% lidocaine. A 21 gauge needle directed into a brachial vein with ultrasound guidance. A wire was advanced centrally and a peel-away sheath was placed. A dual lumen Power PICC line was cut to 36.5 cm and advanced through the peel-away sheath. Catheter tip placed in the lower SVC at the cavoatrial junction. Both lumens aspirated and flushed well. Catheter was flushed with normal saline. Catheter was sutured to the skin and a dressing was placed. Fluoroscopic and ultrasound images were taken and saved for documentation.  COMPLICATIONS: None  FINDINGS: Successful removal of the tunneled central venous catheter. PICC line tip in the lower SVC.  IMPRESSION: Removal of the tunneled right chest catheter.  Successful placement of a left arm PICC line. PICC line tip in the lower SVC and ready to be used.   Electronically Signed   By: Richarda Overlie M.D.   On: 07/04/2013 16:23   Ir US Guide Vasc Access Left  07/04/2013   CLINICAL DATA:  33 year old with bacteremia. Patient has a tunneled central venous catheter in the chest that needs to be removed. Request for a PICC line for antibiotic therapy.  EXAM: REMOVAL TUNNELED CENTRAL VENOUS CATHETER; PLACEMENT OF LEFT ARM PICC LINE WITH ULTRASOUND AND FLUOROSCOPIC GUIDANCE  Physician: Rachelle Hora. Henn, MD  MEDICATIONS: None  ANESTHESIA/SEDATION: Moderate sedation time: None  FLUOROSCOPY TIME:  18 seconds  PROCEDURE: The procedure was explained to the patient. The risks and benefits of the procedure were discussed and the patient's questions were addressed. Informed consent was obtained from the patient. The right chest catheter was prepped and draped in a sterile fashion. Maximal barrier sterile technique was  utilized including caps, mask, sterile gowns, sterile gloves, sterile drape, hand hygiene and skin antiseptic. The skin was anesthetized with 1% lidocaine. The catheter cuff was exposed and the entire catheter was removed without complication. Occlusive dressing was placed over the catheter exit site.  Attention was directed to the left arm. The patient has patent left brachial veins by ultrasound. Left upper arm was prepped and draped in sterile fashion. Maximal barrier sterile technique was utilized including caps, mask, sterile gowns, sterile gloves, sterile drape, hand hygiene and skin antiseptic. Skin was anesthetized with 1% lidocaine. A 21 gauge needle directed into a brachial vein with ultrasound guidance. A wire was advanced centrally and a peel-away sheath was placed. A dual lumen Power PICC line was cut to 36.5 cm and advanced through the peel-away sheath. Catheter tip placed in the lower SVC at the cavoatrial junction. Both lumens aspirated and flushed well. Catheter was flushed with normal saline. Catheter was sutured to the skin and a dressing was placed. Fluoroscopic and ultrasound images were taken and saved for documentation.  COMPLICATIONS: None  FINDINGS: Successful removal of the tunneled central venous catheter. PICC line tip in the lower SVC.  IMPRESSION: Removal of the tunneled right chest catheter.  Successful placement of a left arm PICC line. PICC line tip in the lower SVC and ready to be used.   Electronically Signed   By: Richarda Overlie M.D.   On: 07/04/2013 16:23   Dg Chest Port 1 View  (if Code Sepsis Called)  06/30/2013   CLINICAL DATA:  Fever, cough  EXAM: PORTABLE CHEST - 1 VIEW  COMPARISON:  None.  FINDINGS: Low lung volumes. The cardiac silhouette and mediastinal contours  unremarkable. No focal regions of consolidation or focal infiltrates. A right-sided central venous catheter via an internal jugular approach is identified tip projecting in the region of the superior vena cava.  The osseous structures are unremarkable.  IMPRESSION: No active disease.   Electronically Signed   By: Salome Holmes M.D.   On: 06/30/2013 11:08    Microbiology: Recent Results (from the past 240 hour(s))  CULTURE, BLOOD (ROUTINE X 2)     Status: None   Collection Time    06/30/13 10:20 AM      Result Value Range Status   Specimen Description BLOOD RT. ARM   Final   Special Requests Normal BOTTLES DRAWN AEROBIC AND ANAEROBIC  10CC   Final   Culture  Setup Time     Final   Value: 06/30/2013 14:26     Performed at [facility]   Culture     Final   Value: STAPHYLOCOCCUS AUREUS     Note: Gram Stain Report Called to,Read Back By and Verified With: [facility] STOWE 07/01/13 AT 0515 RIDK RIFAMPIN AND GENTAMICIN SHOULD NOT BE USED AS SINGLE DRUGS FOR TREATMENT OF STAPH INFECTIONS. This organism DOES NOT demonstrate inducible Clindamycin      resistance in vitro.     GRAM NEGATIVE RODS     Note: Gram Stain Report Called to,Read Back By and Verified With: MARGARET THOMPSON 07/03/13 1155 BY SMITHERSJ     Performed at [facility]   Report Status PENDING   Incomplete   Organism ID, Bacteria STAPHYLOCOCCUS AUREUS   Final  CULTURE, BLOOD (SINGLE)     Status: None   Collection Time    06/30/13 10:40 AM      Result Value Range Status   Specimen Description BLOOD LFT CHEST   Final   Special Requests Normal BOTTLES DRAWN AEROBIC AND ANAEROBIC 5CC   Final   Culture  Setup Time     Final   Value: 06/30/2013 14:26     Performed at [facility]   Culture     Final   Value: NO GROWTH 5 DAYS     Performed at [facility]   Report Status 07/06/2013 FINAL   Final  CULTURE, BLOOD (ROUTINE X 2)     Status: None   Collection Time    06/30/13 10:55 AM      Result Value Range Status   Specimen Description BLOOD LEFT AC   Final   Special Requests Normal BOTTLES DRAWN AEROBIC AND ANAEROBIC 5CC   Final   Culture  Setup Time     Final   Value: 06/30/2013 14:26     Performed at  [facility]   Culture     Final   Value: STAPHYLOCOCCUS AUREUS     Note: SUSCEPTIBILITIES PERFORMED ON PREVIOUS CULTURE WITHIN THE LAST 5 DAYS.     Note: Gram Stain Report Called to,Read Back By and Verified With: LAUREN HOUT 07/01/13 @ 12:18PM BY RUSCOE A.     Performed at [facility]   Report Status 07/03/2013 FINAL   Final  URINE CULTURE     Status: None   Collection Time    06/30/13 11:15 AM      Result Value Range Status   Specimen Description URINE, SUPRAPUBIC   Final   Special Requests Normal   Final   Culture  Setup Time     Final   Value: 06/30/2013 14:27     Performed at [facility]  Colony Count     Final   Value: >=100,000 COLONIES/ML     Performed at [facility]   Culture     Final   Value: CITROBACTER BRAAKII     Performed at [facility]   Report Status 07/02/2013 FINAL   Final   Organism ID, Bacteria CITROBACTER BRAAKII   Final  MRSA PCR SCREENING     Status: None   Collection Time    06/30/13  3:58 PM      Result Value Range Status   MRSA by PCR NEGATIVE  NEGATIVE Final   Comment:            The GeneXpert MRSA Assay (FDA     approved for NASAL specimens     only), is one component of a     comprehensive MRSA colonization     surveillance program. It is not     intended to diagnose MRSA     infection nor to guide or     monitor treatment for     MRSA infections.  CULTURE, BLOOD (ROUTINE X 2)     Status: None   Collection Time    07/02/13  2:35 PM      Result Value Range Status   Specimen Description BLOOD LEFT ARM   Final   Special Requests BOTTLES DRAWN AEROBIC AND ANAEROBIC 5CC   Final   Culture  Setup Time     Final   Value: 07/02/2013 22:55     Performed at [facility]   Culture     Final   Value:        BLOOD CULTURE RECEIVED NO GROWTH TO DATE CULTURE WILL BE HELD FOR 5 DAYS BEFORE ISSUING A FINAL NEGATIVE REPORT     Performed at [facility]   Report Status PENDING   Incomplete   CULTURE, BLOOD (ROUTINE X 2)     Status: None   Collection Time    07/02/13  2:40 PM      Result Value Range Status   Specimen Description BLOOD RIGHT ARM   Final   Special Requests BOTTLES DRAWN AEROBIC AND ANAEROBIC 5CC   Final   Culture  Setup Time     Final   Value: 07/02/2013 22:55     Performed at [facility]   Culture     Final   Value:        BLOOD CULTURE RECEIVED NO GROWTH TO DATE CULTURE WILL BE HELD FOR 5 DAYS BEFORE ISSUING A FINAL NEGATIVE REPORT     Performed at [facility]   Report Status PENDING   Incomplete     Labs: Basic Metabolic Panel:  Recent Labs Lab 06/30/13 1634 07/01/13 0425 07/04/13 0500  NA  --  136 141  K  --  3.7 3.8  CL  --  98 104  CO2  --  29 28  GLUCOSE  --  126* 77  BUN  --  6 5*  CREATININE  --  0.94 0.78  CALCIUM  --  8.0* 8.5  MG 1.6  --   --    Liver Function Tests:  Recent Labs Lab 07/01/13 0425  AST 14  ALT 9  ALKPHOS 49  BILITOT 0.4  PROT 5.3*  ALBUMIN 2.9*   No results found for this basename: LIPASE, AMYLASE,  in the last 168 hours No results found for this basename: AMMONIA,  in the last 168 hours CBC:  Recent Labs  Lab 07/01/13 0425 07/02/13 0500 07/06/13 0505  WBC 5.5 5.9 4.8  HGB 11.7* 11.6* 12.2*  HCT 35.2* 35.5* 35.5*  MCV 87.3 87.7 85.3  PLT 165 165 189   Cardiac Enzymes: No results found for this basename: CKTOTAL, CKMB, CKMBINDEX, TROPONINI,  in the last 168 hours BNP: BNP (last 3 results) No results found for this basename: PROBNP,  in the last 8760 hours CBG: No results found for this basename: GLUCAP,  in the last 168 hours     Signed:  Aman Batley C  [facility] 07/07/2013, 3:53 PM

## 2013-07-07 NOTE — Care Management Note (Addendum)
CARE MANAGEMENT NOTE 07/07/2013  Patient:  [name],[name]   Account Number:  [id number]  Date Initiated:  07/04/2013  Documentation initiated by:  [facility]MARY   DC Planning Services  CM consult      [facility]   Choice offered to / List presented to:  C-1 Patient        HH arranged  HH-2 PT  HH-1 RN      [facility]  OTHER - SEE NOTE   Status of service:  Completed, signed off Medicare Important Message given?   (If response is "NO", the following Medicare IM given date fields will be blank) Date Medicare IM given:   Date Additional Medicare IM given:    Discharge Disposition:  HOME W HOME HEALTH SERVICES  Per UR Regulation:    If discussed at Long Length of Stay Meetings, dates discussed:    Comments:  07/07/13  1:15pm Cm spoke with Tresa EndoKelly @ [facility]. They are out of network with Mr. Jennefer Bravorens insurance. CM contacted [facility] [phone], spoke with Jola BabinskiMarilyn. Will fax orders and prescription to her [phone]. Also contacted Darlin Dropebbie Taylor, RN [facility] from 07/08/13 to 07/11/13. [place] [facility] [place] @ 9a and arrive in [place] at 6pm. This [facility] as well. Patient is going to be staying at: [address] [place]  His phone number is   [phone]. IV antibiotics in [place] will [facility] delivered to : [facility] [address] [place], [place]

## 2013-07-07 NOTE — Progress Notes (Signed)
2 Days Post-Op  Subjective:   1 - Neurogenic Bladder with SP Tube - long h/o neurogenic bladder of unknown etiology per report. Used to manage with self-cath, failed interstim, then had Mitrofanoff 2014 that failed, then [facility] catheterizable stoma that failed and partially taken down, finally had SP Tube placed all by primary urologist in [place] 05/31/2013. No problems with tube since placement or in house. [place] today in adequate position.   2 - Bacteruria, Bacteremia - Pt presently admitted for bacteremia / SIRS from suspect infected Hickman catheter. Bacteruria with citrobacter noted, BCX with staph. Renal US this admission without hydro.   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   Today Carol [name] is seen in f/u above. No problems from SPT. Still remains with periodic fevers. ID following as well.    Objective: Vital signs in last 24 hours: Temp:  [99 F (37.2 C)-100 F (37.8 C)] 99 F (37.2 C) (01/02 0444) Pulse Rate:  [81-95] 81 (01/02 0444) Resp:  [16-18] 16 (01/02 0444) BP: (99-118)/(56-66) 99/56 mmHg (01/02 0444) SpO2:  [91 %-97 %] 91 % (01/02 0444) Last BM Date: 07/03/14  Intake/Output from previous day: 01/01 0701 - 01/02 0700 In: 2135 [P.O.:960; I.V.:825; IV Piggyback:350] Out: 2700 [Urine:2700] Intake/Output this shift:    General appearance: alert, cooperative and appears stated age Head: Normocephalic, without obvious abnormality, atraumatic Eyes: conjunctivae/corneas clear. PERRL, EOM's intact. Fundi benign. Ears: normal TM's and external ear canals both ears Nose: Nares normal. Septum midline. Mucosa normal. No drainage or sinus tenderness. Throat: lips, mucosa, and tongue normal; teeth and gums normal Neck: no adenopathy, no carotid bruit, no JVD, supple, symmetrical, trachea midline and thyroid not enlarged, symmetric, no tenderness/mass/nodules Back: symmetric, no curvature. ROM normal. No CVA tenderness. Resp:  clear to auscultation bilaterally Chest wall: no tenderness Cardio: regular rate and rhythm, S1, S2 normal, no murmur, click, rub or gallop GI: soft, non-tender; bowel sounds normal; no masses,  no organomegaly Female genitalia: SPT c/d/i with grossly clear urine. Multipel abd scars that are well healed.  Extremities: extremities normal, atraumatic, no cyanosis or edema Pulses: 2+ and symmetric Skin: Skin color, texture, turgor normal. No rashes or lesions Lymph nodes: Cervical, supraclavicular, and axillary nodes normal. Neurologic: Grossly normal  Lab Results:   Recent Labs  07/06/13 0505  WBC 4.8  HGB 12.2*  HCT 35.5*  PLT 189   BMET No results found for this basename: NA, K, CL, CO2, GLUCOSE, BUN, CREATININE, CALCIUM,  in the last 72 hours PT/INR No results found for this basename: LABPROT, INR,  in the last 72 hours ABG No results found for this basename: PHART, PCO2, PO2, HCO3,  in the last 72 hours  Studies/Results: No results found.  Anti-infectives: Anti-infectives   Start     Dose/Rate Route Frequency Ordered Stop   07/03/13 1400  imipenem-cilastatin (PRIMAXIN) 500 mg in sodium chloride 0.9 % 100 mL IVPB     500 mg 200 mL/hr over 30 Minutes Intravenous Every 6 hours 07/03/13 1240     07/02/13 0300  vancomycin (VANCOCIN) 1,250 mg in sodium chloride 0.9 % 250 mL IVPB     1,250 mg 166.7 mL/hr over 90 Minutes Intravenous Every 8 hours 07/01/13 2134     06/30/13 1900  vancomycin (VANCOCIN) IVPB 1000 mg/200 mL premix  Status:  Discontinued     1,000 mg 200 mL/hr over 60 Minutes Intravenous Every 8 hours 06/30/13 1135 07/01/13 2134   06/30/13 1300  aztreonam (AZACTAM)  1 g in dextrose 5 % 50 mL IVPB  Status:  Discontinued     1 g 100 mL/hr over 30 Minutes Intravenous 3 times per day 06/30/13 1225 07/02/13 1356   06/30/13 1300  levofloxacin (LEVAQUIN) IVPB 750 mg  Status:  Discontinued     750 mg 100 mL/hr over 90 Minutes Intravenous Every 24 hours 06/30/13 1225  06/30/13 1724   06/30/13 1100  vancomycin (VANCOCIN) IVPB 1000 mg/200 mL premix  Status:  Discontinued     1,000 mg 200 mL/hr over 60 Minutes Intravenous  Once 06/30/13 1050 06/30/13 1133   06/30/13 1030  vancomycin (VANCOCIN) IVPB 1000 mg/200 mL premix     1,000 mg 200 mL/hr over 60 Minutes Intravenous  Once 06/30/13 1019 06/30/13 1220   06/30/13 1030  ceFEPIme (MAXIPIME) 2 g in dextrose 5 % 50 mL IVPB  Status:  Discontinued     2 g 100 mL/hr over 30 Minutes Intravenous  Once 06/30/13 1019 06/30/13 1135      Assessment/Plan:  1 - Neurogenic Bladder with SP Tube - current SPT remains adequate position and continues to function well. I prefer to have his primary urologist in [place] change this as pt has incredibly complex GU history and anatomy and this is his first tube change. Should any issues arise from this elective tube change it is most prudent for MD with best understanding of his specific anatomy to perform. No urgent need for change this admission.   2 - Bacteruria, Bacteremia - Renal US w/o hydro, GFR normal, no s/s reversible causes for urinary seeding of blood by imaging and exam. Agree with  ABX focused on coverage of his gram positive and gram negative organisms, also appreciate ID input.  Should his fevers persist for considerably longer would certainly consider contrast (PO + IV) axial imaging given his complex surgicla history to verify no abscess, etc.  3 - Pt to f/u as scheduled with his urologist in [place], will follow prn while in house, call with questions.   [facility], Azana Kiesler 07/07/2013

## 2013-07-07 NOTE — Progress Notes (Signed)
All discharge paperwork and rx given and explained to patient. Patient will be discharged around 2000 this evening once antibiotics have finished infusing and PICC line heparin locked.

## 2013-07-07 NOTE — Progress Notes (Signed)
OT Cancellation Note  Patient Details Name: Salvatore MarvelSidney Mickelsen MRN: [id number] DOB: 06-26-1981   Cancelled Treatment:     Pt does not feel that he will have any issues with BADLs as this point, acute OT will sign off.  Evette GeorgesLeonard, Lovel Suazo Eva [phone] 07/07/2013, 4:33 PM

## 2013-07-08 LAB — CULTURE, BLOOD (ROUTINE X 2)
Culture: NO GROWTH
Culture: NO GROWTH

## 2013-07-10 ENCOUNTER — Encounter: Payer: Self-pay | Admitting: Cardiology

## 2013-07-10 LAB — CULTURE, BLOOD (ROUTINE X 2): Special Requests: NORMAL

## 2013-12-23 ENCOUNTER — Emergency Department (HOSPITAL_BASED_OUTPATIENT_CLINIC_OR_DEPARTMENT_OTHER)
Admission: EM | Admit: 2013-12-23 | Discharge: 2013-12-24 | Disposition: A | Discharge status: Home / Self Care | Payer: Medicare (Managed Care) | Attending: Emergency Medicine | Admitting: Emergency Medicine

## 2013-12-23 ENCOUNTER — Emergency Department (HOSPITAL_BASED_OUTPATIENT_CLINIC_OR_DEPARTMENT_OTHER): Payer: Medicare (Managed Care)

## 2013-12-23 ENCOUNTER — Encounter (HOSPITAL_BASED_OUTPATIENT_CLINIC_OR_DEPARTMENT_OTHER): Payer: Self-pay | Admitting: Emergency Medicine

## 2013-12-23 DIAGNOSIS — Z8659 Personal history of other mental and behavioral disorders: Secondary | ICD-10-CM | POA: Insufficient documentation

## 2013-12-23 DIAGNOSIS — Z8619 Personal history of other infectious and parasitic diseases: Secondary | ICD-10-CM | POA: Insufficient documentation

## 2013-12-23 DIAGNOSIS — R011 Cardiac murmur, unspecified: Secondary | ICD-10-CM | POA: Insufficient documentation

## 2013-12-23 DIAGNOSIS — Z9889 Other specified postprocedural states: Secondary | ICD-10-CM | POA: Insufficient documentation

## 2013-12-23 DIAGNOSIS — Z8719 Personal history of other diseases of the digestive system: Secondary | ICD-10-CM | POA: Insufficient documentation

## 2013-12-23 DIAGNOSIS — J45909 Unspecified asthma, uncomplicated: Secondary | ICD-10-CM | POA: Insufficient documentation

## 2013-12-23 DIAGNOSIS — R609 Edema, unspecified: Secondary | ICD-10-CM | POA: Insufficient documentation

## 2013-12-23 DIAGNOSIS — N390 Urinary tract infection, site not specified: Secondary | ICD-10-CM | POA: Insufficient documentation

## 2013-12-23 DIAGNOSIS — D72829 Elevated white blood cell count, unspecified: Secondary | ICD-10-CM | POA: Insufficient documentation

## 2013-12-23 DIAGNOSIS — G8929 Other chronic pain: Secondary | ICD-10-CM | POA: Insufficient documentation

## 2013-12-23 LAB — CBC WITH DIFFERENTIAL/PLATELET
BASOS PCT: 1 % (ref 0–1)
Basophils Absolute: 0 10*3/uL (ref 0.0–0.1)
Eosinophils Absolute: 0.2 10*3/uL (ref 0.0–0.7)
Eosinophils Relative: 4 % (ref 0–5)
HCT: 32.7 % — ABNORMAL LOW (ref 36.0–46.0)
HEMOGLOBIN: 11 g/dL — AB (ref 12.0–15.0)
LYMPHS ABS: 2 10*3/uL (ref 0.7–4.0)
LYMPHS PCT: 44 % (ref 12–46)
MCH: 28.5 pg (ref 26.0–34.0)
MCHC: 33.6 g/dL (ref 30.0–36.0)
MCV: 84.7 fL (ref 78.0–100.0)
MONOS PCT: 9 % (ref 3–12)
Monocytes Absolute: 0.4 10*3/uL (ref 0.1–1.0)
NEUTROS ABS: 1.9 10*3/uL (ref 1.7–7.7)
Neutrophils Relative %: 42 % — ABNORMAL LOW (ref 43–77)
Platelets: 251 10*3/uL (ref 150–400)
RBC: 3.86 MIL/uL — AB (ref 3.87–5.11)
RDW: 13.3 % (ref 11.5–15.5)
WBC: 4.6 10*3/uL (ref 4.0–10.5)

## 2013-12-23 LAB — COMPREHENSIVE METABOLIC PANEL
ALBUMIN: 4.2 g/dL (ref 3.5–5.2)
ALK PHOS: 71 U/L (ref 39–117)
ALT: 12 U/L (ref 0–35)
AST: 16 U/L (ref 0–37)
BUN: 9 mg/dL (ref 6–23)
CHLORIDE: 104 meq/L (ref 96–112)
CO2: 28 meq/L (ref 19–32)
Calcium: 9.3 mg/dL (ref 8.4–10.5)
Creatinine, Ser: 1.3 mg/dL — ABNORMAL HIGH (ref 0.50–1.10)
GFR calc Af Amer: 61 mL/min — ABNORMAL LOW (ref >90)
GFR calc non Af Amer: 53 mL/min — ABNORMAL LOW (ref >90)
Glucose, Bld: 87 mg/dL (ref 70–99)
POTASSIUM: 4.2 meq/L (ref 3.7–5.3)
Sodium: 142 mEq/L (ref 137–147)
Total Protein: 6.9 g/dL (ref 6.0–8.3)

## 2013-12-23 LAB — URINE MICROSCOPIC-ADD ON

## 2013-12-23 LAB — URINALYSIS, ROUTINE W REFLEX MICROSCOPIC
Bilirubin Urine: NEGATIVE
Glucose, UA: NEGATIVE mg/dL
Hgb urine dipstick: NEGATIVE
Ketones, ur: NEGATIVE mg/dL
Nitrite: POSITIVE — AB
PH: 6.5 (ref 5.0–8.0)
Protein, ur: NEGATIVE mg/dL
SPECIFIC GRAVITY, URINE: 1.013 (ref 1.005–1.030)
Urobilinogen, UA: 1 mg/dL (ref 0.0–1.0)

## 2013-12-23 LAB — PRO B NATRIURETIC PEPTIDE: Pro B Natriuretic peptide (BNP): 54.4 pg/mL (ref 0–125)

## 2013-12-23 LAB — TROPONIN I

## 2013-12-23 LAB — D-DIMER, QUANTITATIVE: D-Dimer, Quant: 0.66 ug/mL-FEU — ABNORMAL HIGH (ref 0.00–0.48)

## 2013-12-23 MED ORDER — ONDANSETRON HCL 4 MG/2ML IJ SOLN
4.0000 mg | Freq: Once | INTRAMUSCULAR | Status: DC
  Filled 2013-12-23: qty 2

## 2013-12-23 MED ORDER — HYDROMORPHONE HCL PF 1 MG/ML IJ SOLN
1.0000 mg | Freq: Once | INTRAMUSCULAR | Status: DC
Start: 2013-12-23 — End: 2013-12-23
  Filled 2013-12-23: qty 1

## 2013-12-23 MED ORDER — LEVOFLOXACIN 500 MG PO TABS: 500.0000 mg | ORAL_TABLET | Freq: Every day | ORAL | Status: AC

## 2013-12-23 MED ORDER — LEVOFLOXACIN IN D5W 500 MG/100ML IV SOLN
500.0000 mg | Freq: Once | INTRAVENOUS | Status: AC
  Administered 2013-12-24: 500 mg via INTRAVENOUS
  Filled 2013-12-23: qty 100

## 2013-12-23 MED ORDER — ONDANSETRON HCL 4 MG/2ML IJ SOLN
4.0000 mg | Freq: Once | INTRAMUSCULAR | Status: AC
  Administered 2013-12-23: 4 mg via INTRAVENOUS

## 2013-12-23 MED ORDER — HYDROMORPHONE HCL PF 1 MG/ML IJ SOLN
1.0000 mg | Freq: Once | INTRAMUSCULAR | Status: DC
  Administered 2013-12-23: 1 mg via INTRAVENOUS

## 2013-12-23 MED ORDER — HYDROMORPHONE HCL PF 1 MG/ML IJ SOLN: 1.0000 mg | Freq: Once | INTRAMUSCULAR | Status: AC

## 2013-12-23 MED ORDER — ONDANSETRON HCL 4 MG/2ML IJ SOLN: 4.0000 mg | Freq: Once | INTRAMUSCULAR | Status: DC

## 2013-12-23 MED ORDER — FUROSEMIDE 20 MG PO TABS
20.0000 mg | ORAL_TABLET | Freq: Every day | ORAL | Status: AC
Start: 2013-12-23 — End: ?

## 2013-12-23 NOTE — ED Provider Notes (Signed)
CSN: [id number]     Arrival date & time 12/23/13  1743 History   First MD Initiated Contact with Patient 12/23/13 1921     Chief Complaint  Patient presents with  . Urinary Tract Infection  . Weakness     (Consider location/radiation/quality/duration/timing/severity/associated sxs/prior Treatment) Patient is a 33 y.o. female presenting with dysuria. The history is provided by the patient. No language interpreter was used.  Dysuria Pain quality:  Aching Pain severity:  Moderate Onset quality:  Gradual Timing:  Constant Progression:  Worsening Chronicity:  New Recent urinary tract infections: no   Relieved by:  Nothing Worsened by:  Nothing tried Ineffective treatments:  None tried Urinary symptoms: no discolored urine   Associated symptoms: no fever   Risk factors: recurrent urinary tract infections   Pt has frequent urinary tract infections.   Pt has a suprapubic catheter.   Pt also concerned about swelling to both legs.  Pt denies any history of leg swelling  Past Medical History  Diagnosis Date  . Neurogenic bladder   . 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 Chronic pain   . Sepsis   . Asthma   . Colitis, ulcerative chronic     not currently taking any medications, aschol  . Bipolar 1 disorder   . Murmur   . Renal insufficiency   . Chronic pain syndrome 06/30/2013  . Asthma, chronic 06/30/2013  . Bipolar disorder, unspecified 06/30/2013  . Ulcerative colitis, unspecified 06/30/2013  . Sleep apnea    Past Surgical History  Procedure Laterality Date  . Mastectomy Bilateral     transgender surgery  . Insertion of suprapubic catheter    . Bladder surgery    . Knee arthroscopy    . Insertion central venous access device w/ subcutaneous port    . Tee without cardioversion N/A 07/05/2013    Procedure: TRANSESOPHAGEAL ECHOCARDIOGRAM (TEE);  Surgeon: Laurey Moralealton S McLean, MD;  [facility];  Service: Cardiovascular;  Laterality: N/A;   History  reviewed. No pertinent family history. History  Substance Use Topics  . Smoking status: Passive Smoke Exposure - Never Smoker  . Smokeless tobacco: Not on file  . Alcohol Use: No   OB History   Grav Para Term Preterm Abortions TAB SAB Ect Mult Living                 Review of Systems  Constitutional: Negative for fever.  Genitourinary: Positive for dysuria.  All other systems reviewed and are negative.     Allergies  Cephalosporins; Lovenox; and Sulfa antibiotics  Home Medications    BP 118/89  Pulse 99  Temp(Src) 98.7 F (37.1 C) (Oral)  Resp 18  Wt 230 lb (104.327 kg)  SpO2 97% Physical Exam  Nursing note and vitals reviewed. Constitutional: She appears well-developed and well-nourished.  HENT:  Head: Normocephalic.  Right Ear: External ear normal.  Left Ear: External ear normal.  Nose: Nose normal.  Mouth/Throat: Oropharynx is clear and moist.  Eyes: Conjunctivae and EOM are normal. Pupils are equal, round, and reactive to light.  Neck: Normal range of motion. Neck supple.  Cardiovascular: Normal rate and regular rhythm.   Pulmonary/Chest: Effort normal and breath sounds normal.  Abdominal: Soft. Bowel sounds are normal.  Musculoskeletal: Normal range of motion.  Neurological: She is alert.  Skin: Skin is warm.    ED Course  Procedures (including critical care time) Labs Review Labs Reviewed  URINALYSIS, ROUTINE W REFLEX MICROSCOPIC  CBC  WITH DIFFERENTIAL  COMPREHENSIVE METABOLIC PANEL  TROPONIN I  D-DIMER, QUANTITATIVE  PRO B NATRIURETIC PEPTIDE    Imaging Review No results found.   EKG Interpretation   Date/Time:  Saturday December 23 2013 19:53:56 EDT Ventricular Rate:  77 PR Interval:  182 QRS Duration: 98 QT Interval:  390 QTC Calculation: 441 R Axis:   4 Text Interpretation:  Normal sinus rhythm Possible Anterior infarct , age  undetermined Abnormal ECG Confirmed by RAY MD, Duwayne HeckANIELLE [phone]([id number]) on  12/23/2013 8:49:28 PM     Results for  orders placed during the hospital encounter of 12/23/13  URINALYSIS, ROUTINE W REFLEX MICROSCOPIC      Result Value Ref Range   Color, Urine YELLOW  YELLOW   APPearance CLOUDY (*) CLEAR   Specific Gravity, Urine 1.013  1.005 - 1.030   pH 6.5  5.0 - 8.0   Glucose, UA NEGATIVE  NEGATIVE mg/dL   Hgb urine dipstick NEGATIVE  NEGATIVE   Bilirubin Urine NEGATIVE  NEGATIVE   Ketones, ur NEGATIVE  NEGATIVE mg/dL   Protein, ur NEGATIVE  NEGATIVE mg/dL   Urobilinogen, UA 1.0  0.0 - 1.0 mg/dL   Nitrite POSITIVE (*) NEGATIVE   Leukocytes, UA LARGE (*) NEGATIVE  CBC WITH DIFFERENTIAL      Result Value Ref Range   WBC 4.6  4.0 - 10.5 K/uL   RBC 3.86 (*) 3.87 - 5.11 MIL/uL   Hemoglobin 11.0 (*) 12.0 - 15.0 g/dL   HCT 60.432.7 (*) 54.036.0 - 98.146.0 %   MCV 84.7  78.0 - 100.0 fL   MCH 28.5  26.0 - 34.0 pg   MCHC 33.6  30.0 - 36.0 g/dL   RDW 19.113.3  47.811.5 - 29.515.5 %   Platelets 251  150 - 400 K/uL   Neutrophils Relative % 42 (*) 43 - 77 %   Neutro Abs 1.9  1.7 - 7.7 K/uL   Lymphocytes Relative 44  12 - 46 %   Lymphs Abs 2.0  0.7 - 4.0 K/uL   Monocytes Relative 9  3 - 12 %   Monocytes Absolute 0.4  0.1 - 1.0 K/uL   Eosinophils Relative 4  0 - 5 %   Eosinophils Absolute 0.2  0.0 - 0.7 K/uL   Basophils Relative 1  0 - 1 %   Basophils Absolute 0.0  0.0 - 0.1 K/uL  COMPREHENSIVE METABOLIC PANEL      Result Value Ref Range   Sodium 142  137 - 147 mEq/L   Potassium 4.2  3.7 - 5.3 mEq/L   Chloride 104  96 - 112 mEq/L   CO2 28  19 - 32 mEq/L   Glucose, Bld 87  70 - 99 mg/dL   BUN 9  6 - 23 mg/dL   Creatinine, Ser 6.211.30 (*) 0.50 - 1.10 mg/dL   Calcium 9.3  8.4 - 30.810.5 mg/dL   Total Protein 6.9  6.0 - 8.3 g/dL   Albumin 4.2  3.5 - 5.2 g/dL   AST 16  0 - 37 U/L   ALT 12  0 - 35 U/L   Alkaline Phosphatase 71  39 - 117 U/L   Total Bilirubin <0.2 (*) 0.3 - 1.2 mg/dL   GFR calc non Af Amer 53 (*) >90 mL/min   GFR calc Af Amer 61 (*) >90 mL/min  TROPONIN I      Result Value Ref Range   Troponin I <0.30  <0.30  ng/mL  D-DIMER, QUANTITATIVE  Result Value Ref Range   D-Dimer, Quant 0.66 (*) 0.00 - 0.48 ug/mL-FEU  PRO B NATRIURETIC PEPTIDE      Result Value Ref Range   Pro B Natriuretic peptide (BNP) 54.4  0 - 125 pg/mL  URINE MICROSCOPIC-ADD ON      Result Value Ref Range   Squamous Epithelial / LPF RARE  RARE   WBC, UA TOO NUMEROUS TO COUNT  <3 WBC/hpf   Bacteria, UA MANY (*) RARE   Dg Chest 2 View  12/23/2013   CLINICAL DATA:  33 year old female with weakness and UTI.  EXAM: CHEST  2 VIEW  COMPARISON:  07/01/2013  FINDINGS: The cardiomediastinal silhouette is unremarkable.  Mild elevation of the right hemidiaphragm again noted.  A right central venous catheter is noted with tip overlying the mid SVC.  A spinal neurostimulator overlying the lower thoracic region again noted.  There is no evidence of focal airspace disease, pulmonary edema, suspicious pulmonary nodule/mass, pleural effusion, or pneumothorax. No acute bony abnormalities are identified.  IMPRESSION: No active cardiopulmonary disease.   Electronically Signed   By: Laveda Abbe M.D.   On: 12/23/2013 20:27   US Venous Img Lower Bilateral  12/23/2013   CLINICAL DATA:  Bilateral leg pain and swelling.  EXAM: BILATERAL LOWER EXTREMITY VENOUS DOPPLER ULTRASOUND  TECHNIQUE: Gray-scale sonography with graded compression, as well as color Doppler and duplex ultrasound were performed to evaluate the lower extremity deep venous systems from the level of the common femoral vein and including the common femoral, femoral, profunda femoral, popliteal and calf veins including the posterior tibial, peroneal and gastrocnemius veins when visible. The superficial great saphenous vein was also interrogated. Spectral Doppler was utilized to evaluate flow at rest and with distal augmentation maneuvers in the common femoral, femoral and popliteal veins.  COMPARISON:  None.  FINDINGS: RIGHT LOWER EXTREMITY  Common Femoral Vein: No evidence of thrombus. Normal  compressibility, respiratory phasicity and response to augmentation.  Saphenofemoral Junction: No evidence of thrombus. Normal compressibility and flow on color Doppler imaging.  Profunda Femoral Vein: No evidence of thrombus. Normal compressibility and flow on color Doppler imaging.  Femoral Vein: No evidence of thrombus. Normal compressibility, respiratory phasicity and response to augmentation.  Popliteal Vein: No evidence of thrombus. Normal compressibility, respiratory phasicity and response to augmentation.  Calf Veins: No evidence of thrombus. Normal compressibility and flow on color Doppler imaging.  Superficial Great Saphenous Vein: No evidence of thrombus. Normal compressibility and flow on color Doppler imaging.  Venous Reflux:  None.  Other Findings:  None.  LEFT LOWER EXTREMITY  Common Femoral Vein: No evidence of thrombus. Normal compressibility, respiratory phasicity and response to augmentation.  Saphenofemoral Junction: No evidence of thrombus. Normal compressibility and flow on color Doppler imaging.  Profunda Femoral Vein: No evidence of thrombus. Normal compressibility and flow on color Doppler imaging.  Femoral Vein: No evidence of thrombus. Normal compressibility, respiratory phasicity and response to augmentation.  Popliteal Vein: No evidence of thrombus. Normal compressibility, respiratory phasicity and response to augmentation.  Calf Veins: No evidence of thrombus. Normal compressibility and flow on color Doppler imaging.  Superficial Great Saphenous Vein: No evidence of thrombus. Normal compressibility and flow on color Doppler imaging.  Venous Reflux:  None.  Other Findings:  None.  IMPRESSION: No evidence of deep venous thrombosis in either lower extremity.   Electronically Signed   By: Trudie Reed M.D.   On: 12/23/2013 21:44    MDM  urine shows positive nitrates large leukocytes  white blood cells too numerous to count and many bacteria CBC shows white blood cell count of 4.6  hemoglobin is 11 patient has slight elevation of creatinine at 1.3 d-dimer is elevated at 0.66 ultrasound of bilateral lower extremities is obtained and is read as negative for DVT in bilateral lower extremities. I discussed results with   Dr. Janae Bridgeman have advised Levaquin. I counseled patient on results I will treat with Lasix for 3 days. Patient is given Levaquin IV here. Patient reports he feels like he needs to be on IV antibiotics at home. Patient advised at this time it does not appear that he requires admission. Patient is advised if fever or chills or signs of increasing illness to present to [facility] long emergency department where urologic consult is available. Final diagnoses:  UTI (lower urinary tract infection)  Peripheral edema    levaquin  Lasix Follow up with your Md for recheck   Elson Areas, PA-C 12/23/13 2303  Lonia Skinner [place], PA-C 12/23/13 2345

## 2013-12-23 NOTE — Discharge Instructions (Signed)

## 2013-12-23 NOTE — ED Notes (Signed)
Pt with suprapubic catheter and infection being treated on macrobid. Reports weakness, "not feeling right", history of sepsis.

## 2013-12-24 NOTE — ED Provider Notes (Signed)
History/physical exam/procedure(s) were performed by non-physician practitioner and as supervising physician I was immediately available for consultation/collaboration. I have reviewed all notes and am in agreement with care and plan.   Hilario Quarryanielle S Christalyn Goertz, MD 12/24/13 [phone]

## 2013-12-26 LAB — URINE CULTURE
Colony Count: >=100000
Special Requests: NORMAL

## 2013-12-27 ENCOUNTER — Telehealth (HOSPITAL_BASED_OUTPATIENT_CLINIC_OR_DEPARTMENT_OTHER): Payer: Self-pay

## 2013-12-27 NOTE — Telephone Encounter (Signed)
Pt informed stated she was already on Nitrofurantoin @ time of ED visit showed [place] Gamero recent cx where Levaquin was resistant.  Pt doesn't want Rx called in will f/u w/her PCP.

## 2013-12-27 NOTE — Progress Notes (Signed)
ED Antimicrobial Stewardship Positive Culture Follow Up   Carol [name] is an 33 y.o. female who presented to [facility] on 12/23/2013 with [name] chief complaint of  Chief Complaint  Patient presents with  . Urinary Tract Infection  . Weakness    Recent Results (from the past 720 hour(s))  URINE CULTURE     Status: None   Collection Time    12/23/13  7:46 PM      Result Value Ref Range Status   Specimen Description URINE, CATHETERIZED   Final   Special Requests Normal   Final   Culture  Setup Time     Final   Value: 12/24/2013 15:13     Performed at [facility]   Colony Count     Final   Value: >=100,000 COLONIES/ML     Performed at [facility]   Culture     Final   Value: STAPHYLOCOCCUS AUREUS     Note: RIFAMPIN AND GENTAMICIN SHOULD NOT BE USED AS SINGLE DRUGS FOR TREATMENT OF STAPH INFECTIONS.     Performed at [facility]   Report Status 12/26/2013 FINAL   Final   Organism ID, Bacteria STAPHYLOCOCCUS AUREUS   Final    [x]  Treated with levofloxacin, organism resistant to prescribed antimicrobial  32 YOF reported to the ED with CC of UTI, weakness, and dysuria. Her history includes recurrent UTIs and suprapubic cath. UA showed many bacteria, + nitrite, and many leukocytes. She was discharge from the ED on levofloxacin. Culture came back today growing MSSA (resistant to levofloxacin). Antibiotics will be adjusted accordingly. Noted patient has cephalosporin and sulfa allergy.   New antibiotic prescription: D/C levofloxacin and start nitrofurantoin 100mg  BID x 7 days  ED Provider: Jaynie Crumbleatyana Kirichenko, PA   Carol [name], Carol [name] 12/27/2013, 9:37 AM Infectious Diseases Pharmacist Phone# [phone]

## 2015-08-26 IMAGING — XA IR REMOVAL TUNNELED CV CATH
1 series · 2 of 2 positions shown · non-contrast
Comparison: none

CLINICAL DATA: 32-year-old with bacteremia. Patient has a tunneled
central venous catheter in the chest that needs to be removed.
Request for a PICC line for antibiotic therapy.

[Series 1: fl cto · 2 of 2 slices shown]
[im 1/2]
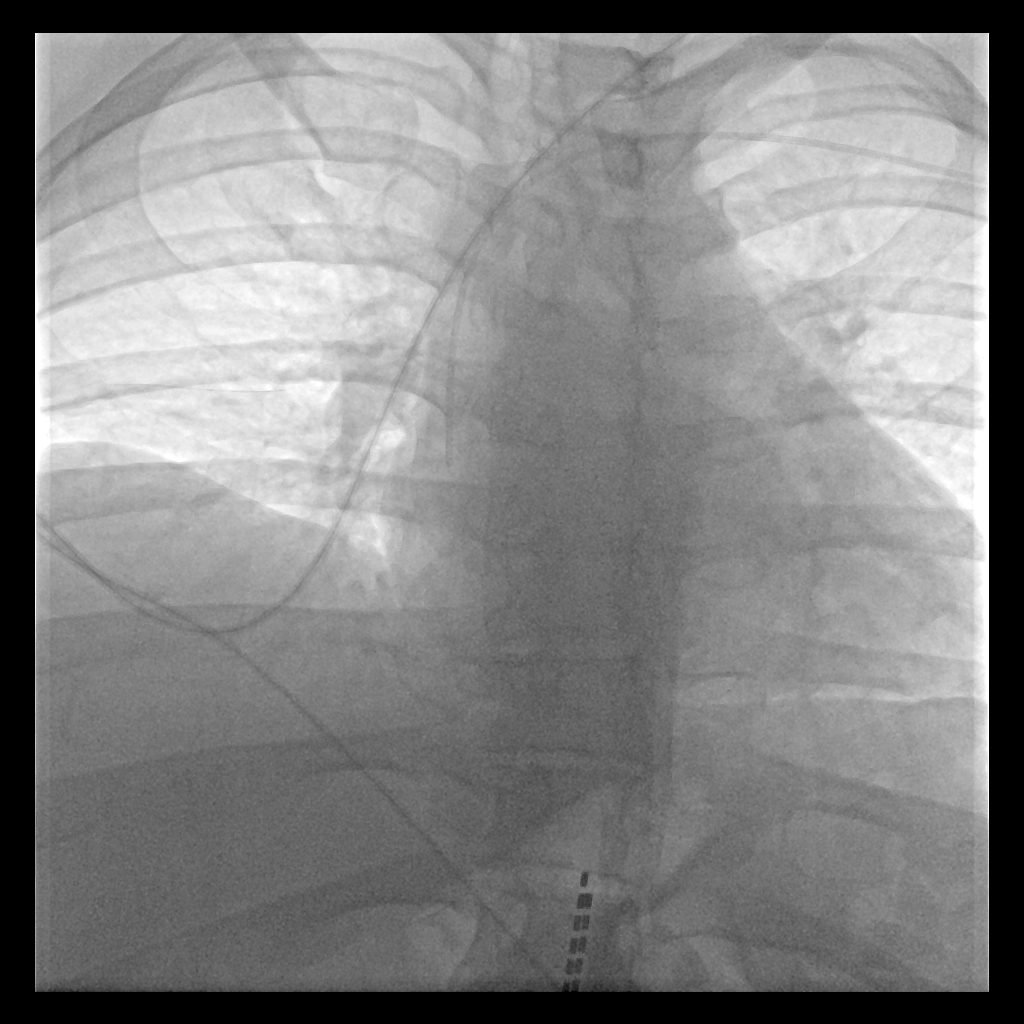
[im 2/2]
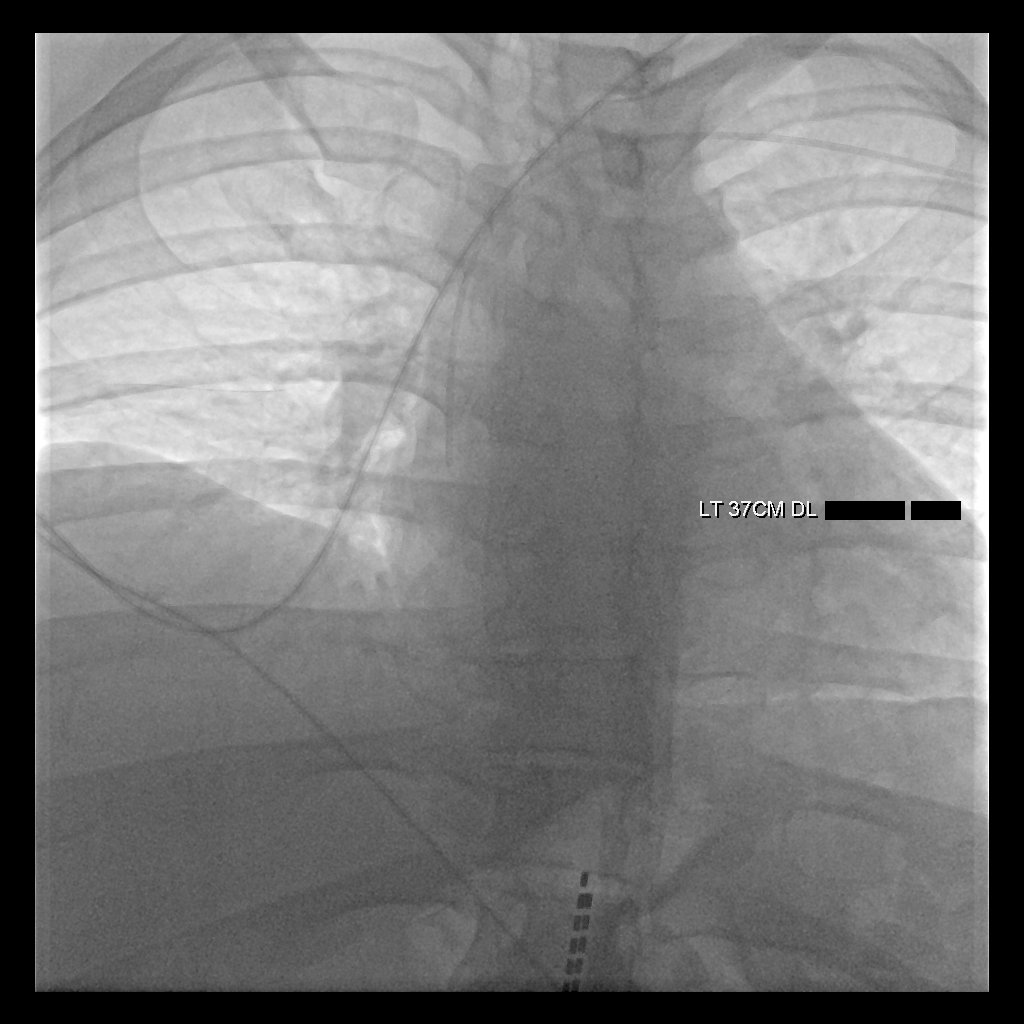

[2 of 2 positions shown; findings below may reference images not displayed]

EXAM:
REMOVAL TUNNELED CENTRAL VENOUS CATHETER; PLACEMENT OF LEFT ARM PICC
LINE WITH ULTRASOUND AND FLUOROSCOPIC GUIDANCE

MEDICATIONS:
None

ANESTHESIA/SEDATION:
Moderate sedation time: None

FLUOROSCOPY TIME:  18 seconds

PROCEDURE:
The procedure was explained to the patient. The risks and benefits
of the procedure were discussed and the patient's questions were
addressed. Informed consent was obtained from the patient. The right
chest catheter was prepped and draped in a sterile fashion. Maximal
barrier sterile technique was utilized including caps, mask, sterile
gowns, sterile gloves, sterile drape, hand hygiene and skin
antiseptic. The skin was anesthetized with 1% lidocaine. The
catheter cuff was exposed and the entire catheter was removed
without complication. Occlusive dressing was placed over the
catheter exit site.

Attention was directed to the left arm. The patient has patent left
brachial veins by ultrasound. Left upper arm was prepped and draped
in sterile fashion. Maximal barrier sterile technique was utilized
including caps, mask, sterile gowns, sterile gloves, sterile drape,
hand hygiene and skin antiseptic. Skin was anesthetized with 1%
lidocaine. A 21 gauge needle directed into a brachial vein with
ultrasound guidance. A wire was advanced centrally and a peel-away
sheath was placed. A dual lumen Power PICC line was cut to 36.5 cm
and advanced through the peel-away sheath. Catheter tip placed in
the lower SVC at the cavoatrial junction. Both lumens aspirated and
flushed well. Catheter was flushed with normal saline. Catheter was
sutured to the skin and a dressing was placed. Fluoroscopic and
ultrasound images were taken and saved for documentation.

COMPLICATIONS:
None
FINDINGS: Successful removal of the tunneled central venous catheter. PICC
line tip in the lower SVC.
IMPRESSION: Removal of the tunneled right chest catheter.

Successful placement of a left arm PICC line. PICC line tip in the
lower SVC and ready to be used.

## 2016-02-14 IMAGING — CR DG CHEST 2V
1 series · 1 of 1 positions shown · non-contrast
Comparison: 07/01/2013

CLINICAL DATA: 32-year-old female with weakness and UTI.

EXAM:
CHEST  2 VIEW

[w chest lat]
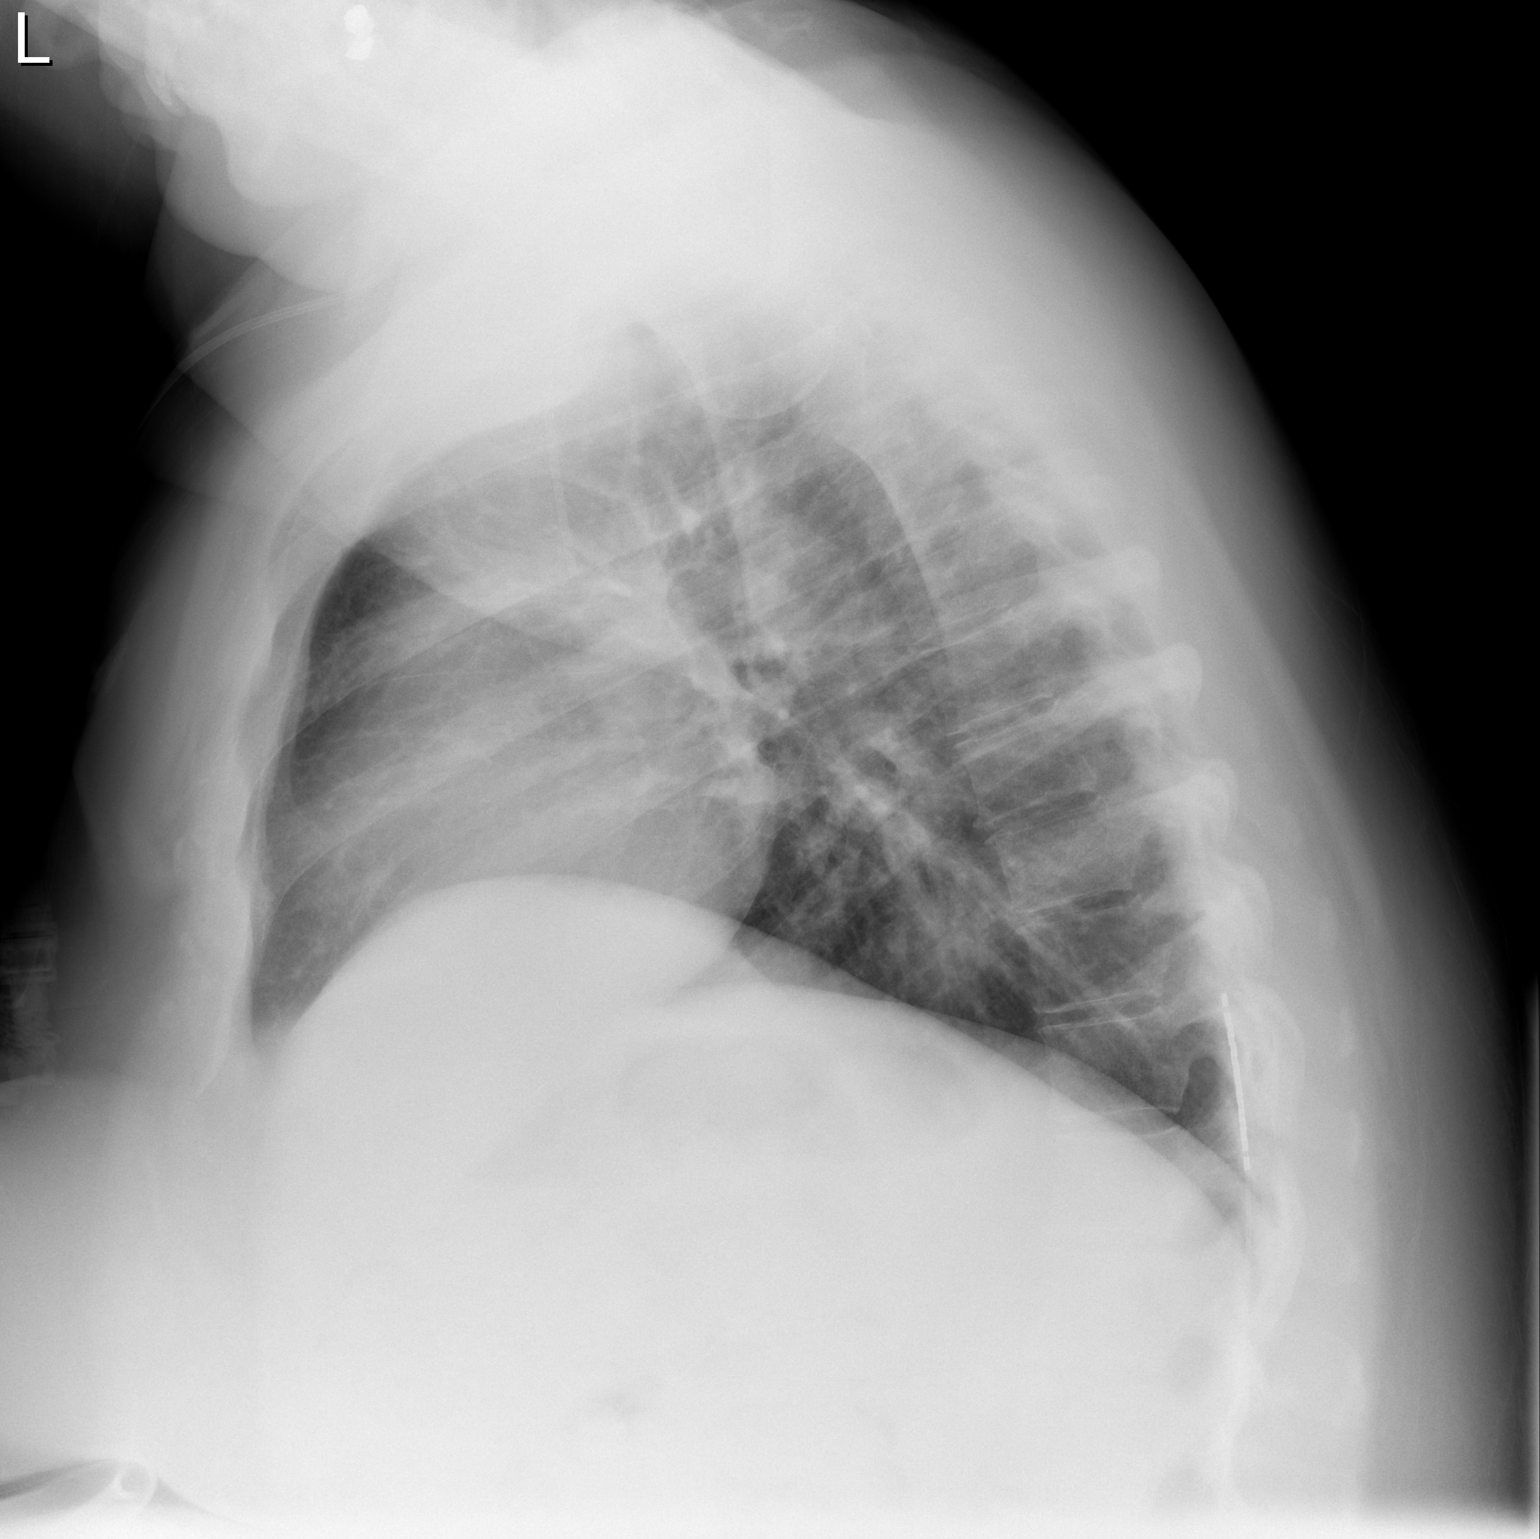

[1 of 1 positions shown; findings below may reference images not displayed]

FINDINGS: The cardiomediastinal silhouette is unremarkable.

Mild elevation of the right hemidiaphragm again noted.

A right central venous catheter is noted with tip overlying the mid
SVC.

A spinal neurostimulator overlying the lower thoracic region again
noted.

There is no evidence of focal airspace disease, pulmonary edema,
suspicious pulmonary nodule/mass, pleural effusion, or pneumothorax.
No acute bony abnormalities are identified.
IMPRESSION: No active cardiopulmonary disease.
